# Patient Record
Sex: Male | Born: 1980 | Race: White | Marital: Single | Smoking: Current every day smoker
Health system: Southern US, Community
[De-identification: ages and names within clinical notes are randomized; demographics above are authoritative.]

## PROBLEM LIST (undated history)

## (undated) DIAGNOSIS — K259 Gastric ulcer, unspecified as acute or chronic, without hemorrhage or perforation: Secondary | ICD-10-CM

## (undated) DIAGNOSIS — K219 Gastro-esophageal reflux disease without esophagitis: Secondary | ICD-10-CM

## (undated) DIAGNOSIS — I10 Essential (primary) hypertension: Secondary | ICD-10-CM

## (undated) DIAGNOSIS — F1910 Other psychoactive substance abuse, uncomplicated: Secondary | ICD-10-CM

## (undated) DIAGNOSIS — T7840XA Allergy, unspecified, initial encounter: Secondary | ICD-10-CM

## (undated) HISTORY — DX: Allergy, unspecified, initial encounter: T78.40XA

## (undated) HISTORY — DX: Gastro-esophageal reflux disease without esophagitis: K21.9

## (undated) HISTORY — DX: Essential (primary) hypertension: I10

## (undated) HISTORY — DX: Other psychoactive substance abuse, uncomplicated: F19.10

## (undated) HISTORY — DX: Gastric ulcer, unspecified as acute or chronic, without hemorrhage or perforation: K25.9

---

## 2010-05-13 ENCOUNTER — Emergency Department (HOSPITAL_COMMUNITY): Payer: Self-pay

## 2010-05-13 ENCOUNTER — Emergency Department (HOSPITAL_COMMUNITY)
Admission: EM | Admit: 2010-05-13 | Discharge: 2010-05-13 | Disposition: A | Discharge status: Home / Self Care | Payer: Self-pay | Attending: Emergency Medicine | Admitting: Emergency Medicine

## 2010-05-13 DIAGNOSIS — R079 Chest pain, unspecified: Secondary | ICD-10-CM | POA: Insufficient documentation

## 2010-05-13 DIAGNOSIS — I498 Other specified cardiac arrhythmias: Secondary | ICD-10-CM | POA: Insufficient documentation

## 2010-05-13 DIAGNOSIS — E871 Hypo-osmolality and hyponatremia: Secondary | ICD-10-CM | POA: Insufficient documentation

## 2010-05-13 DIAGNOSIS — F1010 Alcohol abuse, uncomplicated: Secondary | ICD-10-CM | POA: Insufficient documentation

## 2010-05-13 DIAGNOSIS — E875 Hyperkalemia: Secondary | ICD-10-CM | POA: Insufficient documentation

## 2010-05-13 LAB — BASIC METABOLIC PANEL
BUN: 6 mg/dL (ref 6–23)
Chloride: 92 mEq/L — ABNORMAL LOW (ref 96–112)
Potassium: 5.5 mEq/L — ABNORMAL HIGH (ref 3.5–5.1)
Sodium: 126 mEq/L — ABNORMAL LOW (ref 135–145)

## 2019-10-01 ENCOUNTER — Emergency Department (HOSPITAL_COMMUNITY): Payer: Self-pay

## 2019-10-01 ENCOUNTER — Encounter (HOSPITAL_COMMUNITY): Payer: Self-pay | Admitting: *Deleted

## 2019-10-01 ENCOUNTER — Inpatient Hospital Stay (HOSPITAL_COMMUNITY)
Admission: EM | Admit: 2019-10-01 | Discharge: 2019-10-08 | DRG: 326 | Disposition: A | Discharge status: Home / Self Care | Payer: Self-pay | Attending: General Surgery | Admitting: General Surgery

## 2019-10-01 ENCOUNTER — Other Ambulatory Visit: Payer: Self-pay

## 2019-10-01 DIAGNOSIS — Z8249 Family history of ischemic heart disease and other diseases of the circulatory system: Secondary | ICD-10-CM

## 2019-10-01 DIAGNOSIS — K760 Fatty (change of) liver, not elsewhere classified: Secondary | ICD-10-CM | POA: Diagnosis present

## 2019-10-01 DIAGNOSIS — Z87891 Personal history of nicotine dependence: Secondary | ICD-10-CM

## 2019-10-01 DIAGNOSIS — K255 Chronic or unspecified gastric ulcer with perforation: Principal | ICD-10-CM

## 2019-10-01 DIAGNOSIS — F17290 Nicotine dependence, other tobacco product, uncomplicated: Secondary | ICD-10-CM | POA: Diagnosis present

## 2019-10-01 DIAGNOSIS — Z888 Allergy status to other drugs, medicaments and biological substances status: Secondary | ICD-10-CM

## 2019-10-01 DIAGNOSIS — E876 Hypokalemia: Secondary | ICD-10-CM

## 2019-10-01 DIAGNOSIS — I10 Essential (primary) hypertension: Secondary | ICD-10-CM | POA: Diagnosis present

## 2019-10-01 DIAGNOSIS — K265 Chronic or unspecified duodenal ulcer with perforation: Secondary | ICD-10-CM

## 2019-10-01 DIAGNOSIS — K251 Acute gastric ulcer with perforation: Secondary | ICD-10-CM | POA: Insufficient documentation

## 2019-10-01 DIAGNOSIS — K275 Chronic or unspecified peptic ulcer, site unspecified, with perforation: Secondary | ICD-10-CM

## 2019-10-01 DIAGNOSIS — K7010 Alcoholic hepatitis without ascites: Secondary | ICD-10-CM

## 2019-10-01 DIAGNOSIS — K709 Alcoholic liver disease, unspecified: Secondary | ICD-10-CM | POA: Diagnosis present

## 2019-10-01 DIAGNOSIS — K659 Peritonitis, unspecified: Secondary | ICD-10-CM | POA: Diagnosis present

## 2019-10-01 DIAGNOSIS — Z880 Allergy status to penicillin: Secondary | ICD-10-CM

## 2019-10-01 DIAGNOSIS — Z20822 Contact with and (suspected) exposure to covid-19: Secondary | ICD-10-CM | POA: Diagnosis present

## 2019-10-01 DIAGNOSIS — F1010 Alcohol abuse, uncomplicated: Secondary | ICD-10-CM

## 2019-10-01 LAB — COMPREHENSIVE METABOLIC PANEL
ALT: 17 U/L (ref 0–44)
AST: 28 U/L (ref 15–41)
Albumin: 4.5 g/dL (ref 3.5–5.0)
Alkaline Phosphatase: 82 U/L (ref 38–126)
Anion gap: 15 (ref 5–15)
BUN: 10 mg/dL (ref 6–20)
CO2: 22 mmol/L (ref 22–32)
Calcium: 9 mg/dL (ref 8.9–10.3)
Chloride: 94 mmol/L — ABNORMAL LOW (ref 98–111)
Creatinine, Ser: 0.7 mg/dL (ref 0.61–1.24)
GFR calc Af Amer: >60 mL/min (ref >60)
GFR calc non Af Amer: >60 mL/min (ref >60)
Glucose, Bld: 160 mg/dL — ABNORMAL HIGH (ref 70–99)
Potassium: 3.4 mmol/L — ABNORMAL LOW (ref 3.5–5.1)
Sodium: 131 mmol/L — ABNORMAL LOW (ref 135–145)
Total Bilirubin: 0.7 mg/dL (ref 0.3–1.2)
Total Protein: 7.8 g/dL (ref 6.5–8.1)

## 2019-10-01 LAB — CBC
HCT: 47.1 % (ref 39.0–52.0)
Hemoglobin: 15.9 g/dL (ref 13.0–17.0)
MCH: 32.8 pg (ref 26.0–34.0)
MCHC: 33.8 g/dL (ref 30.0–36.0)
MCV: 97.1 fL (ref 80.0–100.0)
Platelets: 405 10*3/uL — ABNORMAL HIGH (ref 150–400)
RBC: 4.85 MIL/uL (ref 4.22–5.81)
RDW: 12 % (ref 11.5–15.5)
WBC: 10.5 10*3/uL (ref 4.0–10.5)
nRBC: 0 % (ref 0.0–0.2)

## 2019-10-01 LAB — LIPASE, BLOOD: Lipase: 29 U/L (ref 11–51)

## 2019-10-01 MED ORDER — IOHEXOL 300 MG/ML  SOLN
100.0000 mL | Freq: Once | INTRAMUSCULAR | Status: AC | PRN
  Administered 2019-10-01: 100 mL via INTRAVENOUS

## 2019-10-01 MED ORDER — SODIUM CHLORIDE (PF) 0.9 % IJ SOLN
INTRAMUSCULAR | Status: AC
  Filled 2019-10-01: qty 50

## 2019-10-01 MED ORDER — MORPHINE SULFATE (PF) 4 MG/ML IV SOLN
4.0000 mg | Freq: Once | INTRAVENOUS | Status: AC
  Administered 2019-10-01: 4 mg via INTRAVENOUS
  Filled 2019-10-01: qty 1

## 2019-10-01 MED ORDER — SODIUM CHLORIDE 0.9% FLUSH: 3.0000 mL | Freq: Once | INTRAVENOUS | Status: DC

## 2019-10-01 MED ORDER — ONDANSETRON HCL 4 MG/2ML IJ SOLN
4.0000 mg | Freq: Once | INTRAMUSCULAR | Status: AC
  Administered 2019-10-01: 4 mg via INTRAVENOUS
  Filled 2019-10-01: qty 2

## 2019-10-01 NOTE — ED Provider Notes (Signed)
[facility]-EMERGENCY DEPT Provider Note   CSN: [id number] Arrival date & time: 10/01/19  2043     History Chief Complaint  Patient presents with  . Abdominal Pain    Danny Miller is a 39 y.o. male.  HPI     This is a 39 year old male with a history of alcohol abuse who presents with abdominal pain.  He reports acute onset of mid and right upper quadrant abdominal pain that has migrated to his entire abdomen.  He reports that it is sharp and nonradiating.  He rates his pain at 10 out of 10.  He has had nausea without vomiting.  No urinary symptoms.  No fevers, diarrhea, constipation.  Patient had not recently eaten prior to onset of pain.  He drinks 6-8 drinks daily.  No known history of pancreatitis.  History reviewed. No pertinent past medical history.  There are no problems to display for this patient.   History reviewed. No pertinent surgical history.     No family history on file.  Social History   Tobacco Use  . Smoking status: Never Smoker  . Smokeless tobacco: Never Used  Vaping Use  . Vaping Use: Every day  Substance Use Topics  . Alcohol use: Yes  . Drug use: Never    Home Medications Prior to Admission medications   Not on File    Allergies    Penicillins  Review of Systems   Review of Systems  Constitutional: Negative for fever.  Respiratory: Negative for shortness of breath.   Cardiovascular: Negative for chest pain.  Gastrointestinal: Positive for abdominal pain and nausea. Negative for constipation, diarrhea and vomiting.  Genitourinary: Negative for dysuria and hematuria.  All other systems reviewed and are negative.   Physical Exam Updated Vital Signs BP (!) 137/91   Pulse 98   Resp 15   Ht 1.753 m (5\' 9" )   Wt 68 kg   SpO2 97%   BMI 22.15 kg/m   Physical Exam Vitals and nursing note reviewed.  Constitutional:      Appearance: He is well-developed.     Comments: Uncomfortable appearing but nontoxic    HENT:     Head: Normocephalic and atraumatic.     Mouth/Throat:     Mouth: Mucous membranes are moist.  Eyes:     Pupils: Pupils are equal, round, and reactive to light.  Cardiovascular:     Rate and Rhythm: Normal rate and regular rhythm.     Heart sounds: Normal heart sounds. No murmur heard.   Pulmonary:     Effort: Pulmonary effort is normal. No respiratory distress.     Breath sounds: Normal breath sounds. No wheezing.  Abdominal:     General: Bowel sounds are normal.     Palpations: Abdomen is soft.     Tenderness: There is generalized abdominal tenderness and tenderness in the right upper quadrant and epigastric area. There is no rebound.     Comments: Voluntary guarding  Musculoskeletal:     Cervical back: Neck supple.  Lymphadenopathy:     Cervical: No cervical adenopathy.  Skin:    General: Skin is warm and dry.  Neurological:     Mental Status: He is alert and oriented to person, place, and time.  Psychiatric:        Mood and Affect: Mood is anxious.     ED Results / Procedures / Treatments   Labs (all labs ordered are listed, but only abnormal results are displayed) Labs Reviewed  COMPREHENSIVE METABOLIC PANEL - Abnormal; Notable for the following components:      Result Value   Sodium 131 (*)    Potassium 3.4 (*)    Chloride 94 (*)    Glucose, Bld 160 (*)    All other components within normal limits  CBC - Abnormal; Notable for the following components:   Platelets 405 (*)    All other components within normal limits  URINALYSIS, ROUTINE W REFLEX MICROSCOPIC - Abnormal; Notable for the following components:   Specific Gravity, Urine 1.038 (*)    Ketones, ur 20 (*)    All other components within normal limits  SARS CORONAVIRUS 2 BY RT PCR (HOSPITAL ORDER, PERFORMED IN [facility])  LIPASE, BLOOD  PROTIME-INR    EKG None  Radiology CT ABDOMEN PELVIS W CONTRAST  Result Date: 10/02/2019 CLINICAL DATA:  Nausea and vomiting EXAM: CT  ABDOMEN AND PELVIS WITH CONTRAST TECHNIQUE: Multidetector CT imaging of the abdomen and pelvis was performed using the standard protocol following bolus administration of intravenous contrast. CONTRAST:  OMNIPAQUE IOHEXOL 300 MG/ML  SOLN COMPARISON:  None. FINDINGS: LOWER CHEST: Normal. HEPATOBILIARY: Normal hepatic contours. No intra- or extrahepatic biliary dilatation. There is fluid and free air adjacent to the gallbladder. PANCREAS: Normal pancreas. No ductal dilatation or peripancreatic fluid collection. SPLEEN: Normal. ADRENALS/URINARY TRACT: The adrenal glands are normal. No hydronephrosis, nephroureterolithiasis or solid renal mass. The urinary bladder is normal for degree of distention STOMACH/BOWEL: There is small volume pneumoperitoneum, evident along the anterior hepatic margin and in the gallbladder fossa. There is hyperemia of the gastroduodenal junction wall with an apparent defect along the ventral aspect (axial image 36; sagittal images 77 and 78). There is moderate free fluid within the abdomen. The small bowel within the lower abdomen is hyperenhancing. No colonic abnormality. Appendix is normal. VASCULAR/LYMPHATIC: Normal course and caliber of the major abdominal vessels. No abdominal or pelvic lymphadenopathy. REPRODUCTIVE: Normal prostate size with symmetric seminal vesicles. MUSCULOSKELETAL. No bony spinal canal stenosis or focal osseous abnormality. OTHER: None. IMPRESSION: 1. Small volume pneumoperitoneum and free intraperitoneal fluid, likely secondary to perforated gastroduodenal ulcer (3:67, 6:77). 2. Hyperenhancement of the small bowel within the lower abdomen, likely reactive. And inflammatory or infectious enteritis would be difficult to exclude. Critical Value/emergent results were called by telephone at the time of interpretation on 10/02/2019 at 12:42 am to Dr. Ross Marcus , who verbally acknowledged these results. Electronically Signed   By: Deatra Robinson M.D.   On:  10/02/2019 00:42    Procedures Procedures (including critical care time)  CRITICAL CARE Performed by: Shon Baton   Total critical care time: 45 minutes  Critical care time was exclusive of separately billable procedures and treating other patients.  Critical care was necessary to treat or prevent imminent or life-threatening deterioration.  Critical care was time spent personally by me on the following activities: development of treatment plan with patient and/or surrogate as well as nursing, discussions with consultants, evaluation of patient's response to treatment, examination of patient, obtaining history from patient or surrogate, ordering and performing treatments and interventions, ordering and review of laboratory studies, ordering and review of radiographic studies, pulse oximetry and re-evaluation of patient's condition.   Medications Ordered in ED Medications  sodium chloride flush (NS) 0.9 % injection 3 mL (has no administration in time range)  metroNIDAZOLE (FLAGYL) IVPB 500 mg (500 mg Intravenous New Bag/Given 10/02/19 0057)  HYDROmorphone (DILAUDID) injection 1 mg (has no administration in time range)  morphine 4 MG/ML injection 4 mg (4 mg Intravenous Given 10/01/19 2324)  ondansetron (ZOFRAN) injection 4 mg (4 mg Intravenous Given 10/01/19 2323)  iohexol (OMNIPAQUE) 300 MG/ML solution 100 mL (100 mLs Intravenous Contrast Given 10/01/19 2350)  pantoprazole (PROTONIX) injection 40 mg (40 mg Intravenous Given 10/02/19 0055)  sodium chloride 0.9 % bolus 1,000 mL (1,000 mLs Intravenous New Bag/Given 10/02/19 0058)    ED Course  I have reviewed the triage vital signs and the nursing notes.  Pertinent labs & imaging results that were available during my care of the patient were reviewed by me and considered in my medical decision making (see chart for details).  Clinical Course as of Oct 01 104  Thu Oct 02, 2019  0048 Spoke with radiology.  Concern for perforated  duodenal ulcer.  Noted to have free air.  Patient was given cefepime and Flagyl for intra-abdominal infection given history of penicillin allergy.  He was given fluids and general surgery was consulted.  Covid swab pending.   [CH]  6063 Spoke with Dr. Michaell Cowing.  Asked for coags.  Will evaluate patient for emergent surgery.   [CH]    Clinical Course User Index [CH] Gabriel Paulding, Mayer Masker, MD   MDM Rules/Calculators/A&P                           Patient presents with fairly acute onset of abdominal pain.  He is uncomfortable appearing but nontoxic.  He is diffusely tender on the abdomen has some guarding suggestive of peritonitis.  Given his alcohol abuse, perforated ulcer would be high on the list.  Additionally, pancreatitis.  Less likely cholecystitis, appendicitis.  Patient was given pain and nausea medication.  Lab work obtained and reviewed.  Slight metabolic derangements with hyponatremia and hypokalemia.  CT scan obtained.  See clinical course above.  CT is concerning for a perforated gastroduodenal ulcer.  Patient covered with cefepime and Flagyl.  Dr. Michaell Cowing to consult for emergency surgery.  Currently he is not septic appearing but is still uncomfortable.  Pain medication reordered.  Patient's last alcoholic drink was last night.  Currently he does not appear to be in acute withdrawal but is at high risk.  Final Clinical Impression(s) / ED Diagnoses Final diagnoses:  Perforated ulcer (HCC)    Rx / DC Orders ED Discharge Orders    None       Jimya Ciani, Mayer Masker, MD 10/02/19 [phone]

## 2019-10-01 NOTE — ED Notes (Signed)
Pt attempted to give urine sample, Pt states that he is in too much pain to urinate at this time.

## 2019-10-01 NOTE — ED Triage Notes (Signed)
Sudden onset of rt upper abd pain a few hours ago, pt diaphoretic in triage, No n/v/d noted, Pt has never had pain this severe before.

## 2019-10-01 NOTE — ED Notes (Signed)
Patient transported to CT 

## 2019-10-02 ENCOUNTER — Encounter (HOSPITAL_COMMUNITY): Admission: EM | Disposition: A | Discharge status: Home / Self Care | Payer: Self-pay | Source: Home / Self Care

## 2019-10-02 ENCOUNTER — Emergency Department (HOSPITAL_COMMUNITY): Payer: Self-pay | Admitting: Certified Registered Nurse Anesthetist

## 2019-10-02 DIAGNOSIS — Z87891 Personal history of nicotine dependence: Secondary | ICD-10-CM

## 2019-10-02 DIAGNOSIS — K255 Chronic or unspecified gastric ulcer with perforation: Secondary | ICD-10-CM | POA: Diagnosis present

## 2019-10-02 DIAGNOSIS — K251 Acute gastric ulcer with perforation: Secondary | ICD-10-CM | POA: Insufficient documentation

## 2019-10-02 DIAGNOSIS — F1010 Alcohol abuse, uncomplicated: Secondary | ICD-10-CM

## 2019-10-02 DIAGNOSIS — K7010 Alcoholic hepatitis without ascites: Secondary | ICD-10-CM

## 2019-10-02 DIAGNOSIS — E876 Hypokalemia: Secondary | ICD-10-CM

## 2019-10-02 HISTORY — PX: LAPAROSCOPY: SHX197

## 2019-10-02 LAB — URINALYSIS, ROUTINE W REFLEX MICROSCOPIC
Bilirubin Urine: NEGATIVE
Glucose, UA: NEGATIVE mg/dL
Hgb urine dipstick: NEGATIVE
Ketones, ur: 20 mg/dL — AB
Leukocytes,Ua: NEGATIVE
Nitrite: NEGATIVE
Protein, ur: NEGATIVE mg/dL
Specific Gravity, Urine: 1.038 — ABNORMAL HIGH (ref 1.005–1.030)
pH: 5 (ref 5.0–8.0)

## 2019-10-02 LAB — HIV ANTIBODY (ROUTINE TESTING W REFLEX): HIV Screen 4th Generation wRfx: NONREACTIVE

## 2019-10-02 LAB — PROTIME-INR
INR: 1.3 — ABNORMAL HIGH (ref 0.8–1.2)
Prothrombin Time: 15.4 seconds — ABNORMAL HIGH (ref 11.4–15.2)

## 2019-10-02 LAB — PHOSPHORUS: Phosphorus: 4 mg/dL (ref 2.5–4.6)

## 2019-10-02 LAB — MAGNESIUM: Magnesium: 1.7 mg/dL (ref 1.7–2.4)

## 2019-10-02 LAB — SARS CORONAVIRUS 2 BY RT PCR (HOSPITAL ORDER, PERFORMED IN ~~LOC~~ HOSPITAL LAB): SARS Coronavirus 2: NEGATIVE

## 2019-10-02 SURGERY — LAPAROSCOPY, DIAGNOSTIC
Anesthesia: General | Site: Abdomen

## 2019-10-02 MED ORDER — PANTOPRAZOLE SODIUM 40 MG IV SOLR
40.0000 mg | Freq: Once | INTRAVENOUS | Status: AC
  Administered 2019-10-02: 40 mg via INTRAVENOUS
  Filled 2019-10-02: qty 40

## 2019-10-02 MED ORDER — LORAZEPAM 2 MG/ML IJ SOLN: 0.0000 mg | Freq: Two times a day (BID) | INTRAMUSCULAR | Status: AC

## 2019-10-02 MED ORDER — ROCURONIUM BROMIDE 10 MG/ML (PF) SYRINGE
PREFILLED_SYRINGE | INTRAVENOUS | Status: DC | PRN
  Administered 2019-10-02: 50 mg via INTRAVENOUS

## 2019-10-02 MED ORDER — HYDROMORPHONE HCL 1 MG/ML IJ SOLN
1.0000 mg | Freq: Once | INTRAMUSCULAR | Status: AC
  Administered 2019-10-02: 1 mg via INTRAVENOUS
  Filled 2019-10-02: qty 1

## 2019-10-02 MED ORDER — MIDAZOLAM HCL 5 MG/5ML IJ SOLN
INTRAMUSCULAR | Status: DC | PRN
  Administered 2019-10-02: 2 mg via INTRAVENOUS

## 2019-10-02 MED ORDER — CHLORHEXIDINE GLUCONATE CLOTH 2 % EX PADS
6.0000 | MEDICATED_PAD | Freq: Once | CUTANEOUS | Status: AC
  Administered 2019-10-02: 6 via TOPICAL

## 2019-10-02 MED ORDER — LACTATED RINGERS IV BOLUS
1000.0000 mL | Freq: Once | INTRAVENOUS | Status: AC
  Administered 2019-10-02: 1000 mL via INTRAVENOUS

## 2019-10-02 MED ORDER — PHENOL 1.4 % MT LIQD
2.0000 | OROMUCOSAL | Status: DC | PRN
  Filled 2019-10-02 (×2): qty 177

## 2019-10-02 MED ORDER — ACETAMINOPHEN 650 MG RE SUPP: 650.0000 mg | Freq: Four times a day (QID) | RECTAL | Status: DC | PRN

## 2019-10-02 MED ORDER — BISACODYL 10 MG RE SUPP
10.0000 mg | Freq: Two times a day (BID) | RECTAL | Status: DC | PRN
  Filled 2019-10-02: qty 1

## 2019-10-02 MED ORDER — METRONIDAZOLE IN NACL 5-0.79 MG/ML-% IV SOLN
500.0000 mg | Freq: Three times a day (TID) | INTRAVENOUS | Status: AC
  Administered 2019-10-02 – 2019-10-07 (×15): 500 mg via INTRAVENOUS
  Filled 2019-10-02 (×15): qty 100

## 2019-10-02 MED ORDER — FENTANYL CITRATE (PF) 100 MCG/2ML IJ SOLN
25.0000 ug | INTRAMUSCULAR | Status: DC | PRN
  Administered 2019-10-02: 25 ug via INTRAVENOUS

## 2019-10-02 MED ORDER — LIP MEDEX EX OINT
1.0000 "application " | TOPICAL_OINTMENT | Freq: Two times a day (BID) | CUTANEOUS | Status: DC
  Administered 2019-10-02 – 2019-10-08 (×10): 1 via TOPICAL
  Filled 2019-10-02: qty 7

## 2019-10-02 MED ORDER — BUPIVACAINE-EPINEPHRINE 0.25% -1:200000 IJ SOLN
INTRAMUSCULAR | Status: DC | PRN
  Administered 2019-10-02: 50 mL

## 2019-10-02 MED ORDER — SODIUM CHLORIDE 0.9 % IV SOLN: Freq: Three times a day (TID) | INTRAVENOUS | Status: DC | PRN

## 2019-10-02 MED ORDER — METHOCARBAMOL 1000 MG/10ML IJ SOLN
1000.0000 mg | Freq: Four times a day (QID) | INTRAVENOUS | Status: DC | PRN
  Filled 2019-10-02: qty 10

## 2019-10-02 MED ORDER — ROCURONIUM BROMIDE 10 MG/ML (PF) SYRINGE
PREFILLED_SYRINGE | INTRAVENOUS | Status: AC
  Filled 2019-10-02: qty 10

## 2019-10-02 MED ORDER — SODIUM CHLORIDE 0.9 % IV SOLN
2.0000 g | Freq: Three times a day (TID) | INTRAVENOUS | Status: AC
  Administered 2019-10-03 – 2019-10-06 (×12): 2 g via INTRAVENOUS
  Filled 2019-10-02 (×12): qty 2

## 2019-10-02 MED ORDER — MIDAZOLAM HCL 2 MG/2ML IJ SOLN
INTRAMUSCULAR | Status: AC
  Filled 2019-10-02: qty 2

## 2019-10-02 MED ORDER — LIDOCAINE 2% (20 MG/ML) 5 ML SYRINGE
INTRAMUSCULAR | Status: DC | PRN
  Administered 2019-10-02: 60 mg via INTRAVENOUS

## 2019-10-02 MED ORDER — MAGNESIUM SULFATE 2 GM/50ML IV SOLN
2.0000 g | Freq: Once | INTRAVENOUS | Status: DC
  Filled 2019-10-02: qty 50

## 2019-10-02 MED ORDER — SODIUM CHLORIDE 0.9 % IV SOLN
80.0000 mg | Freq: Once | INTRAVENOUS | Status: AC
  Administered 2019-10-02: 80 mg via INTRAVENOUS
  Filled 2019-10-02: qty 80

## 2019-10-02 MED ORDER — SODIUM CHLORIDE 0.9 % IV SOLN
2.0000 g | Freq: Once | INTRAVENOUS | Status: AC
  Administered 2019-10-02: 2 g via INTRAVENOUS
  Filled 2019-10-02: qty 2

## 2019-10-02 MED ORDER — ONDANSETRON HCL 4 MG/2ML IJ SOLN
INTRAMUSCULAR | Status: AC
  Filled 2019-10-02: qty 2

## 2019-10-02 MED ORDER — LACTATED RINGERS IV BOLUS: 1000.0000 mL | Freq: Three times a day (TID) | INTRAVENOUS | Status: AC | PRN

## 2019-10-02 MED ORDER — PROPOFOL 10 MG/ML IV BOLUS
INTRAVENOUS | Status: DC | PRN
  Administered 2019-10-02: 170150 mg via INTRAVENOUS

## 2019-10-02 MED ORDER — CLINDAMYCIN PHOSPHATE 900 MG/50ML IV SOLN
INTRAVENOUS | Status: AC
  Administered 2019-10-02: 900 mg
  Filled 2019-10-02: qty 50

## 2019-10-02 MED ORDER — SIMETHICONE 80 MG PO CHEW
40.0000 mg | CHEWABLE_TABLET | Freq: Four times a day (QID) | ORAL | Status: DC | PRN
  Filled 2019-10-02: qty 1

## 2019-10-02 MED ORDER — FENTANYL CITRATE (PF) 100 MCG/2ML IJ SOLN
INTRAMUSCULAR | Status: AC
  Filled 2019-10-02: qty 2

## 2019-10-02 MED ORDER — LACTATED RINGERS IV SOLN
INTRAVENOUS | Status: DC
  Administered 2019-10-02: 1000 mL via INTRAVENOUS

## 2019-10-02 MED ORDER — ALBUMIN HUMAN 5 % IV SOLN
INTRAVENOUS | Status: AC
  Filled 2019-10-02: qty 250

## 2019-10-02 MED ORDER — METOPROLOL TARTRATE 5 MG/5ML IV SOLN: 5.0000 mg | Freq: Four times a day (QID) | INTRAVENOUS | Status: DC | PRN

## 2019-10-02 MED ORDER — LORAZEPAM 2 MG/ML IJ SOLN: 1.0000 mg | INTRAMUSCULAR | Status: AC | PRN

## 2019-10-02 MED ORDER — MENTHOL 3 MG MT LOZG
1.0000 | LOZENGE | OROMUCOSAL | Status: DC | PRN
  Filled 2019-10-02 (×3): qty 9

## 2019-10-02 MED ORDER — ACETAMINOPHEN 10 MG/ML IV SOLN
INTRAVENOUS | Status: DC | PRN
Start: 2019-10-02 — End: 2019-10-02
  Administered 2019-10-02: 1000 mg via INTRAVENOUS

## 2019-10-02 MED ORDER — LORAZEPAM 1 MG PO TABS: 1.0000 mg | ORAL_TABLET | ORAL | Status: AC | PRN

## 2019-10-02 MED ORDER — FENTANYL CITRATE (PF) 100 MCG/2ML IJ SOLN
INTRAMUSCULAR | Status: DC | PRN
  Administered 2019-10-02: 100 ug via INTRAVENOUS
  Administered 2019-10-02 (×3): 50 ug via INTRAVENOUS

## 2019-10-02 MED ORDER — ENOXAPARIN SODIUM 40 MG/0.4ML ~~LOC~~ SOLN
40.0000 mg | SUBCUTANEOUS | Status: DC
  Administered 2019-10-02 – 2019-10-07 (×6): 40 mg via SUBCUTANEOUS
  Filled 2019-10-02 (×6): qty 0.4

## 2019-10-02 MED ORDER — FENTANYL CITRATE (PF) 250 MCG/5ML IJ SOLN
INTRAMUSCULAR | Status: AC
  Filled 2019-10-02: qty 5

## 2019-10-02 MED ORDER — POTASSIUM CHLORIDE 10 MEQ/100ML IV SOLN
10.0000 meq | INTRAVENOUS | Status: AC
  Administered 2019-10-02: 10 meq via INTRAVENOUS
  Filled 2019-10-02: qty 100

## 2019-10-02 MED ORDER — LIDOCAINE 2% (20 MG/ML) 5 ML SYRINGE
INTRAMUSCULAR | Status: AC
  Filled 2019-10-02: qty 5

## 2019-10-02 MED ORDER — HYDROMORPHONE HCL 1 MG/ML IJ SOLN: 0.2500 mg | INTRAMUSCULAR | Status: DC | PRN

## 2019-10-02 MED ORDER — DIPHENHYDRAMINE HCL 50 MG/ML IJ SOLN: 12.5000 mg | Freq: Four times a day (QID) | INTRAMUSCULAR | Status: DC | PRN

## 2019-10-02 MED ORDER — MAGIC MOUTHWASH
15.0000 mL | Freq: Four times a day (QID) | ORAL | Status: DC | PRN
  Filled 2019-10-02: qty 15

## 2019-10-02 MED ORDER — SUCCINYLCHOLINE CHLORIDE 200 MG/10ML IV SOSY
PREFILLED_SYRINGE | INTRAVENOUS | Status: AC
  Filled 2019-10-02: qty 10

## 2019-10-02 MED ORDER — DEXAMETHASONE SODIUM PHOSPHATE 4 MG/ML IJ SOLN
INTRAMUSCULAR | Status: DC | PRN
  Administered 2019-10-02: 10 mg via INTRAVENOUS

## 2019-10-02 MED ORDER — SODIUM CHLORIDE 0.9 % IV SOLN
8.0000 mg/h | INTRAVENOUS | Status: AC
  Administered 2019-10-02 – 2019-10-05 (×5): 8 mg/h via INTRAVENOUS
  Filled 2019-10-02 (×7): qty 80

## 2019-10-02 MED ORDER — PROMETHAZINE HCL 25 MG/ML IJ SOLN: 6.2500 mg | INTRAMUSCULAR | Status: DC | PRN

## 2019-10-02 MED ORDER — OXYCODONE HCL 5 MG PO TABS: 5.0000 mg | ORAL_TABLET | Freq: Once | ORAL | Status: DC | PRN

## 2019-10-02 MED ORDER — CLINDAMYCIN PHOSPHATE 900 MG/50ML IV SOLN: 900.0000 mg | INTRAVENOUS | Status: AC

## 2019-10-02 MED ORDER — ONDANSETRON HCL 4 MG/2ML IJ SOLN
INTRAMUSCULAR | Status: DC | PRN
  Administered 2019-10-02: 4 mg via INTRAVENOUS

## 2019-10-02 MED ORDER — BUPIVACAINE LIPOSOME 1.3 % IJ SUSP
INTRAMUSCULAR | Status: DC | PRN
  Administered 2019-10-02: 20 mL

## 2019-10-02 MED ORDER — METRONIDAZOLE IN NACL 5-0.79 MG/ML-% IV SOLN
500.0000 mg | Freq: Once | INTRAVENOUS | Status: AC
  Administered 2019-10-02: 500 mg via INTRAVENOUS
  Filled 2019-10-02: qty 100

## 2019-10-02 MED ORDER — PANTOPRAZOLE SODIUM 40 MG IV SOLR: 40.0000 mg | Freq: Two times a day (BID) | INTRAVENOUS | Status: DC

## 2019-10-02 MED ORDER — PHENYLEPHRINE 40 MCG/ML (10ML) SYRINGE FOR IV PUSH (FOR BLOOD PRESSURE SUPPORT)
PREFILLED_SYRINGE | INTRAVENOUS | Status: DC | PRN
  Administered 2019-10-02 (×2): 120 ug via INTRAVENOUS
  Administered 2019-10-02: 80 ug via INTRAVENOUS
  Administered 2019-10-02: 120 ug via INTRAVENOUS
  Administered 2019-10-02: 200 ug via INTRAVENOUS

## 2019-10-02 MED ORDER — OXYCODONE HCL 5 MG/5ML PO SOLN: 5.0000 mg | Freq: Once | ORAL | Status: DC | PRN

## 2019-10-02 MED ORDER — THIAMINE HCL 100 MG/ML IJ SOLN
100.0000 mg | Freq: Every day | INTRAMUSCULAR | Status: DC
  Administered 2019-10-02 – 2019-10-07 (×5): 100 mg via INTRAVENOUS
  Filled 2019-10-02 (×5): qty 2

## 2019-10-02 MED ORDER — SODIUM CHLORIDE 0.9 % IV BOLUS
1000.0000 mL | Freq: Once | INTRAVENOUS | Status: AC
  Administered 2019-10-02: 1000 mL via INTRAVENOUS

## 2019-10-02 MED ORDER — PHENYLEPHRINE HCL-NACL 10-0.9 MG/250ML-% IV SOLN
INTRAVENOUS | Status: DC | PRN
  Administered 2019-10-02: 35 ug/min via INTRAVENOUS

## 2019-10-02 MED ORDER — SODIUM CHLORIDE 0.9 % IV SOLN
2.0000 g | Freq: Three times a day (TID) | INTRAVENOUS | Status: DC
  Administered 2019-10-02: 2 g via INTRAVENOUS
  Filled 2019-10-02 (×7): qty 2

## 2019-10-02 MED ORDER — DEXTROSE 5 % IV SOLN
5.0000 mg/kg | INTRAVENOUS | Status: AC
  Administered 2019-10-02: 340 mg via INTRAVENOUS
  Filled 2019-10-02 (×2): qty 8.5

## 2019-10-02 MED ORDER — PHENYLEPHRINE 40 MCG/ML (10ML) SYRINGE FOR IV PUSH (FOR BLOOD PRESSURE SUPPORT)
PREFILLED_SYRINGE | INTRAVENOUS | Status: AC
  Filled 2019-10-02: qty 10

## 2019-10-02 MED ORDER — LACTATED RINGERS IV SOLN: INTRAVENOUS | Status: DC | PRN

## 2019-10-02 MED ORDER — MEPERIDINE HCL 50 MG/ML IJ SOLN: 6.2500 mg | INTRAMUSCULAR | Status: DC | PRN

## 2019-10-02 MED ORDER — ACETAMINOPHEN 10 MG/ML IV SOLN
INTRAVENOUS | Status: AC
  Filled 2019-10-02: qty 100

## 2019-10-02 MED ORDER — BUPIVACAINE-EPINEPHRINE 0.25% -1:200000 IJ SOLN
INTRAMUSCULAR | Status: AC
  Filled 2019-10-02: qty 1

## 2019-10-02 MED ORDER — BUPIVACAINE LIPOSOME 1.3 % IJ SUSP
20.0000 mL | Freq: Once | INTRAMUSCULAR | Status: DC
  Filled 2019-10-02: qty 20

## 2019-10-02 MED ORDER — HYDROMORPHONE HCL 1 MG/ML IJ SOLN
0.5000 mg | INTRAMUSCULAR | Status: DC | PRN
  Administered 2019-10-02 – 2019-10-04 (×5): 1 mg via INTRAVENOUS
  Filled 2019-10-02 (×6): qty 1

## 2019-10-02 MED ORDER — SUCCINYLCHOLINE CHLORIDE 200 MG/10ML IV SOSY
PREFILLED_SYRINGE | INTRAVENOUS | Status: DC | PRN
  Administered 2019-10-02: 120 mg via INTRAVENOUS

## 2019-10-02 MED ORDER — 0.9 % SODIUM CHLORIDE (POUR BTL) OPTIME
TOPICAL | Status: DC | PRN
  Administered 2019-10-02: 1000 mL

## 2019-10-02 MED ORDER — SODIUM CHLORIDE 0.9 % IV SOLN
8.0000 mg | Freq: Four times a day (QID) | INTRAVENOUS | Status: DC | PRN
  Filled 2019-10-02: qty 4

## 2019-10-02 MED ORDER — LORAZEPAM 2 MG/ML IJ SOLN: 0.0000 mg | Freq: Four times a day (QID) | INTRAMUSCULAR | Status: AC

## 2019-10-02 MED ORDER — PROCHLORPERAZINE EDISYLATE 10 MG/2ML IJ SOLN: 5.0000 mg | INTRAMUSCULAR | Status: DC | PRN

## 2019-10-02 MED ORDER — ALBUMIN HUMAN 5 % IV SOLN: INTRAVENOUS | Status: DC | PRN

## 2019-10-02 MED ORDER — THIAMINE HCL 100 MG PO TABS
100.0000 mg | ORAL_TABLET | Freq: Every day | ORAL | Status: DC
  Administered 2019-10-04 – 2019-10-08 (×2): 100 mg via ORAL
  Filled 2019-10-02 (×2): qty 1

## 2019-10-02 MED ORDER — PANTOPRAZOLE SODIUM 40 MG IV SOLR
40.0000 mg | Freq: Two times a day (BID) | INTRAVENOUS | Status: DC
  Administered 2019-10-05 – 2019-10-08 (×6): 40 mg via INTRAVENOUS
  Filled 2019-10-02 (×8): qty 40

## 2019-10-02 MED ORDER — DEXAMETHASONE SODIUM PHOSPHATE 10 MG/ML IJ SOLN
INTRAMUSCULAR | Status: AC
  Filled 2019-10-02: qty 1

## 2019-10-02 MED ORDER — ONDANSETRON HCL 4 MG/2ML IJ SOLN: 4.0000 mg | Freq: Four times a day (QID) | INTRAMUSCULAR | Status: DC | PRN

## 2019-10-02 MED ORDER — LACTATED RINGERS IR SOLN
Status: DC | PRN
  Administered 2019-10-02: 5000 mL
  Administered 2019-10-02: 4000 mL

## 2019-10-02 SURGICAL SUPPLY — 42 items
CABLE HIGH FREQUENCY MONO STRZ (ELECTRODE) ×2 IMPLANT
COVER SURGICAL LIGHT HANDLE (MISCELLANEOUS) ×2 IMPLANT
COVER WAND RF STERILE (DRAPES) IMPLANT
DECANTER SPIKE VIAL GLASS SM (MISCELLANEOUS) ×2 IMPLANT
DRAPE UTILITY XL STRL (DRAPES) ×2 IMPLANT
DRAPE WARM FLUID 44X44 (DRAPES) ×2 IMPLANT
DRSG TEGADERM 2-3/8X2-3/4 SM (GAUZE/BANDAGES/DRESSINGS) ×6 IMPLANT
DRSG TEGADERM 4X4.75 (GAUZE/BANDAGES/DRESSINGS) ×4 IMPLANT
ELECT REM PT RETURN 15FT ADLT (MISCELLANEOUS) ×2 IMPLANT
GAUZE SPONGE 2X2 8PLY STRL LF (GAUZE/BANDAGES/DRESSINGS) ×1 IMPLANT
GLOVE BIO SURGEON STRL SZ8 (GLOVE) ×2 IMPLANT
GLOVE BIOGEL PI IND STRL 7.5 (GLOVE) ×2 IMPLANT
GLOVE BIOGEL PI IND STRL 8 (GLOVE) ×2 IMPLANT
GLOVE BIOGEL PI INDICATOR 7.5 (GLOVE) ×2
GLOVE BIOGEL PI INDICATOR 8 (GLOVE) ×2
GLOVE ECLIPSE 8.0 STRL XLNG CF (GLOVE) ×2 IMPLANT
GLOVE INDICATOR 8.0 STRL GRN (GLOVE) ×2 IMPLANT
GOWN STRL REUS W/TWL XL LVL3 (GOWN DISPOSABLE) ×4 IMPLANT
IRRIG SUCT STRYKERFLOW 2 WTIP (MISCELLANEOUS) ×2
IRRIGATION SUCT STRKRFLW 2 WTP (MISCELLANEOUS) ×1 IMPLANT
KIT BASIN (CUSTOM PROCEDURE TRAY) ×2 IMPLANT
KIT TURNOVER KIT A (KITS) ×2 IMPLANT
PAD POSITIONING PINK XL (MISCELLANEOUS) ×2 IMPLANT
SCISSORS LAP 5X35 DISP (ENDOMECHANICALS) ×2 IMPLANT
SEALER TISSUE G2 STRG ARTC 35C (ENDOMECHANICALS) IMPLANT
SET TUBE SMOKE EVAC HIGH FLOW (TUBING) ×2 IMPLANT
SHEARS HARMONIC ACE PLUS 36CM (ENDOMECHANICALS) ×2 IMPLANT
SLEEVE XCEL OPT CAN 5 100 (ENDOMECHANICALS) ×4 IMPLANT
SPONGE GAUZE 2X2 STER 10/PKG (GAUZE/BANDAGES/DRESSINGS) ×1
SUT MNCRL AB 4-0 PS2 18 (SUTURE) ×2 IMPLANT
SUT SILK 2 0 (SUTURE) ×1
SUT SILK 2 0 SH CR/8 (SUTURE) ×2 IMPLANT
SUT SILK 2-0 18XBRD TIE 12 (SUTURE) ×1 IMPLANT
SUT SILK 3 0 (SUTURE) ×1
SUT SILK 3 0 SH CR/8 (SUTURE) ×2 IMPLANT
SUT SILK 3-0 18XBRD TIE 12 (SUTURE) ×1 IMPLANT
SUT VLOC 180 2-0 6IN GS21 (SUTURE) ×2 IMPLANT
TOWEL OR 17X26 10 PK STRL BLUE (TOWEL DISPOSABLE) ×2 IMPLANT
TOWEL OR NON WOVEN STRL DISP B (DISPOSABLE) ×2 IMPLANT
TRAY LAPAROSCOPIC (CUSTOM PROCEDURE TRAY) ×2 IMPLANT
TROCAR BLADELESS OPT 5 100 (ENDOMECHANICALS) ×2 IMPLANT
TROCAR XCEL NON-BLD 11X100MML (ENDOMECHANICALS) IMPLANT

## 2019-10-02 NOTE — H&P (Addendum)
Danny Miller  [birth year] [id number]  CARE TEAM:  PCP: Patient, No Pcp Per  Outpatient Care Team: Patient Care Team: Patient, No Pcp Per as PCP - General (General Practice)  Inpatient Treatment Team: Treatment Team: Attending Provider: Shon Baton, MD; Registered Nurse: Terressa Koyanagi, RN; Consulting Physician: Montez Morita, Md, MD   This patient is a 39 y.o.male who presents today for surgical evaluation at the request of Dr Wilkie Aye.   Chief complaint / Reason for evaluation: Severe abdominal pain with free air.  Probable perforated ulcer  39 year old male.  History of alcohol drinking rather heavily.  6-8 drinks a night.  Noted severe abdominal pain today.  Gradually worsened and intensified.  Became unbearable.  Nauseated with decreased appetite.  No emesis.  Came emergency room.  Concern for abdominal peritonitis.  CT scan shows dots of pneumoperitoneum in the upper abdomen with focus most likely around the gastroduodenal junction.  Highly suspicious for perforated ulcer.  Patient will have some thickened small intestinal loops.  Patient denies any dietary changes or sick contacts.  He has never had any prior abdominal surgery.  He vapes but does not smoke tobacco.  Denies any heart or lung issues.  Normally can walk a couple miles without difficulty.  Normally moves his bowels a couple times a day  No personal nor family history of GI/colon cancer, inflammatory bowel disease, irritable bowel syndrome, allergy such as Celiac Sprue, dietary/dairy problems, colitis, ulcers nor gastritis.  No recent sick contacts/gastroenteritis.  No travel outside the country.  No changes in diet.  No dysphagia to solids or liquids.  No significant heartburn or reflux.  No hematochezia, hematemesis, coffee ground emesis.  No evidence of prior gastric/peptic ulceration.    Assessment  Danny Miller  39 y.o. male    Problem List:  Principal Problem:   Perforated duodenal ulcer (HCC) Active  Problems:   Alcohol consumption binge drinking   History of nicotine vaping   Peritonitis with free air suspicion for perforated ulcer  Plan:  Admission  IV antibiotics.  Nausea and pain control.  Laparoscopic possible open exploration with probable omental [name] patch of perforated ulcer.  The anatomy & physiology of the digestive tract was discussed.  The pathophysiology of perforation was discussed.  Differential diagnosis such as perforated ulcer or colon, etc was discussed.   Natural history risks without surgery such as death was discussed.  I recommended abdominal exploration to diagnose & treat the source of the problem.  Laparoscopic & open techniques were discussed.   Risks such as bleeding, infection, abscess, leak, reoperation, bowel resection, possible ostomy, injury to other organs, need for repair of tissues / organs, hernia, heart attack, death, and other risks were discussed.   The risks of no intervention will lead to serious problems including death.   I expressed a good likelihood that surgery will address the problem.    Goals of post-operative recovery were discussed as well.  We will work to minimize complications although risks in an emergent setting are high.   Questions were answered.  The patient expressed understanding & wishes to proceed with surgery.       Heavy alcohol consumption.  I strongly recommend he quit drinking since he has now had a life-threatening complication to his alcoholism and is at very high risk of alcohol threatening his life and reducing his life expectancy.  PPI  NGT  Hypokalemia-correct  Hypomagnesemia-correct.  VTE prophylaxis- SCDs, etc   35 minutes spent in review,  evaluation, examination, counseling, and coordination of care.  More than 50% of that time was spent in counseling.  Ardeth Sportsman, MD, FACS, MASCRS Gastrointestinal and Minimally Invasive Surgery  [facility] [address] [place], [place] [address] [phone] Fax [phone] Main/Paging  CONTACT INFORMATION: Weekday (9AM-5PM) concerns: Call [facility] main office at [phone] Weeknight (5PM-9AM) or Weekend/Holiday concerns: Check [email or web address] for General Surgery [facility] coverage (Please, do not use SecureChat as it is not reliable communication to operating surgeons for immediate patient care)      10/02/2019      History reviewed. No pertinent past medical history.  History reviewed. No pertinent surgical history.  Social History   Socioeconomic History  . Marital status: Single    Spouse name: Not on file  . Number of children: Not on file  . Years of education: Not on file  . Highest education level: Not on file  Occupational History  . Not on file  Tobacco Use  . Smoking status: Never Smoker  . Smokeless tobacco: Never Used  Vaping Use  . Vaping Use: Every day  Substance and Sexual Activity  . Alcohol use: Yes  . Drug use: Never  . Sexual activity: Not on file  Other Topics Concern  . Not on file  Social History Narrative  . Not on file   Social Determinants of Health   Financial Resource Strain:   . Difficulty of Paying Living Expenses:   Food Insecurity:   . Worried About Programme researcher, broadcasting/film/video in the Last Year:   . Barista in the Last Year:   Transportation Needs:   . Freight forwarder (Medical):   Marland Kitchen Lack of Transportation (Non-Medical):   Physical Activity:   . Days of Exercise per Week:   . Minutes of Exercise per Session:   Stress:   . Feeling of Stress :   Social Connections:   . Frequency of Communication with Friends and Family:   . Frequency of Social Gatherings with Friends and Family:   . Attends Religious Services:   . Active Member of Clubs or Organizations:   . Attends Banker Meetings:   Marland Kitchen Marital Status:   Intimate Partner Violence:   . Fear of Current or Ex-Partner:   . Emotionally Abused:   Marland Kitchen Physically Abused:   .  Sexually Abused:     No family history on file.  Current Facility-Administered Medications  Medication Dose Route Frequency Provider Last Rate Last Admin  . ceFEPIme (MAXIPIME) 2 g in sodium chloride 0.9 % 100 mL IVPB  2 g Intravenous Once Horton, Mayer Masker, MD      . Chlorhexidine Gluconate Cloth 2 % PADS 6 each  6 each Topical Once Karie Soda, MD      . clindamycin (CLEOCIN) IVPB 900 mg  900 mg Intravenous On Call to OR Karie Soda, MD       And  . gentamicin (GARAMYCIN) 340 mg in dextrose 5 % 100 mL IVPB  5 mg/kg Intravenous On Call to OR Karie Soda, MD      . lactated ringers bolus 1,000 mL  1,000 mL Intravenous Once Karie Soda, MD      . metroNIDAZOLE (FLAGYL) IVPB 500 mg  500 mg Intravenous Once Shon Baton, MD 100 mL/hr at 10/02/19 0057 500 mg at 10/02/19 0057  . potassium chloride 10 mEq in 100 mL IVPB  10 mEq Intravenous Q1 Hr x 4 Jeffifer Rabold, Viviann Spare,  MD      . sodium chloride flush (NS) 0.9 % injection 3 mL  3 mL Intravenous Once Horton, Mayer Maskerourtney F, MD       Current Outpatient Medications  Medication Sig Dispense Refill  . aspirin EC 81 MG tablet Take 81 mg by mouth daily. Swallow whole.       Allergies  Allergen Reactions  . Penicillins     ROS:   All other systems reviewed & are negative except per HPI or as noted below: Constitutional:  No fevers, +chills, +sweats.  Weight stable Eyes:  No vision changes, No discharge HENT:  No sore throats, nasal drainage Lymph: No neck swelling, No bruising easily Pulmonary:  No cough, productive sputum CV: No orthopnea, PND  Patient walks 60 minutes for about 2 miles without difficulty.  No exertional chest/neck/shoulder/arm pain. GI: No personal nor family history of GI/colon cancer, inflammatory bowel disease, irritable bowel syndrome, allergy such as Celiac Sprue, dietary/dairy problems, colitis, ulcers nor gastritis.  No recent sick contacts/gastroenteritis.  No travel outside the country.  No changes in  diet. Renal: No UTIs, No hematuria Genital:  No drainage, bleeding, masses Musculoskeletal: No severe joint pain.  Good ROM major joints Skin:  No sores or lesions.  No rashes Heme/Lymph:  No easy bleeding.  No swollen lymph nodes Neuro: No focal weakness/numbness.  No seizures Psych: No suicidal ideation.  No hallucinations  BP 129/74   Pulse (!) 110   Resp 17   Ht 5\' 9"  (1.753 m)   Wt 68 kg   SpO2 100%   BMI 22.15 kg/m   Physical Exam: Constitutional: Patient obviously uncomfortable in moderate distress.  Not cachectic.  Hygeine adequate.  Vitals signs as above.   Eyes: Pupils reactive, normal extraocular movements. Sclera nonicteric Neuro: CN II-XII intact.  No major focal sensory defects.  No major motor deficits. Lymph: No head/neck/groin lymphadenopathy Psych:  No severe agitation.  No severe anxiety.  Judgment & insight Adequate, Oriented x4, HENT: Normocephalic, Mucus membranes moist.  No thrush.   Neck: Supple, No tracheal deviation.  No obvious thyromegaly Chest: No pain to chest wall compression.  Good respiratory excursion.  No audible wheezing CV:  Pulses intact.  Regular rhythm.  No major extremity edema Abdomen:  Somewhat firm.  Mildly distended.  Tenderness at epigastric region with guarding.  Pain to bed shake and percussion consistent with peritonitis.  Lower abdomen nontender. No incarcerated hernias.  No hepatomegaly.  No splenomegaly Gen:  No inguinal hernias.  No inguinal lymphadenopathy.   Ext: No obvious deformity or contracture no significant edema.  No cyanosis Skin: No major subcutaneous nodules.  Warm and dry Musculoskeletal: Severe joint rigidity not present.  No obvious clubbing.  No digital petechiae.     Results:   Labs: Results for orders placed or performed during the hospital encounter of 10/01/19 (from the past 48 hour(s))  Lipase, blood     Status: None   Collection Time: 10/01/19  9:14 PM  Result Value Ref Range   Lipase 29 11 - 51 U/L     Comment: Performed at [facility], [address] [place], [place] [id number]  Comprehensive metabolic panel     Status: Abnormal   Collection Time: 10/01/19  9:14 PM  Result Value Ref Range   Sodium 131 (L) 135 - 145 mmol/L   Potassium 3.4 (L) 3.5 - 5.1 mmol/L   Chloride 94 (L) 98 - 111 mmol/L   CO2 22 22 - 32  mmol/L   Glucose, Bld 160 (H) 70 - 99 mg/dL    Comment: Glucose reference range applies only to samples taken after fasting for at least 8 hours.   BUN 10 6 - 20 mg/dL   Creatinine, Ser 1.44 0.61 - 1.24 mg/dL   Calcium 9.0 8.9 - 81.8 mg/dL   Total Protein 7.8 6.5 - 8.1 g/dL   Albumin 4.5 3.5 - 5.0 g/dL   AST 28 15 - 41 U/L   ALT 17 0 - 44 U/L   Alkaline Phosphatase 82 38 - 126 U/L   Total Bilirubin 0.7 0.3 - 1.2 mg/dL   GFR calc non Af Amer >60 >60 mL/min   GFR calc Af Amer >60 >60 mL/min   Anion gap 15 5 - 15    Comment: Performed at [facility], [address] [place], [place] [address]  CBC     Status: Abnormal   Collection Time: 10/01/19  9:14 PM  Result Value Ref Range   WBC 10.5 4.0 - 10.5 K/uL   RBC 4.85 4.22 - 5.81 MIL/uL   Hemoglobin 15.9 13.0 - 17.0 g/dL   HCT 97.0 39 - 52 %   MCV 97.1 80.0 - 100.0 fL   MCH 32.8 26.0 - 34.0 pg   MCHC 33.8 30.0 - 36.0 g/dL   RDW 26.3 78.5 - 88.5 %   Platelets 405 (H) 150 - 400 K/uL   nRBC 0.0 0.0 - 0.2 %    Comment: Performed at [facility], [address] [place], [place] [address]  Urinalysis, Routine w reflex microscopic     Status: Abnormal   Collection Time: 10/02/19 12:10 AM  Result Value Ref Range   Color, Urine YELLOW YELLOW   APPearance CLEAR CLEAR   Specific Gravity, Urine 1.038 (H) 1.005 - 1.030   pH 5.0 5.0 - 8.0   Glucose, UA NEGATIVE NEGATIVE mg/dL   Hgb urine dipstick NEGATIVE NEGATIVE   Bilirubin Urine NEGATIVE NEGATIVE   Ketones, ur 20 (A) NEGATIVE mg/dL   Protein, ur NEGATIVE NEGATIVE mg/dL   Nitrite NEGATIVE NEGATIVE   Leukocytes,Ua  NEGATIVE NEGATIVE    Comment: Performed at [facility], [address], [place], [place] [address]  Protime-INR     Status: Abnormal   Collection Time: 10/02/19  1:07 AM  Result Value Ref Range   Prothrombin Time 15.4 (H) 11.4 - 15.2 seconds   INR 1.3 (H) 0.8 - 1.2    Comment: (NOTE) INR goal varies based on device and disease states. Performed at [facility], [address] [place], [place] [address]     Imaging / Studies: CT ABDOMEN PELVIS W CONTRAST  Result Date: 10/02/2019 CLINICAL DATA:  Nausea and vomiting EXAM: CT ABDOMEN AND PELVIS WITH CONTRAST TECHNIQUE: Multidetector CT imaging of the abdomen and pelvis was performed using the standard protocol following bolus administration of intravenous contrast. CONTRAST:  OMNIPAQUE IOHEXOL 300 MG/ML  SOLN COMPARISON:  None. FINDINGS: LOWER CHEST: Normal. HEPATOBILIARY: Normal hepatic contours. No intra- or extrahepatic biliary dilatation. There is fluid and free air adjacent to the gallbladder. PANCREAS: Normal pancreas. No ductal dilatation or peripancreatic fluid collection. SPLEEN: Normal. ADRENALS/URINARY TRACT: The adrenal glands are normal. No hydronephrosis, nephroureterolithiasis or solid renal mass. The urinary bladder is normal for degree of distention STOMACH/BOWEL: There is small volume pneumoperitoneum, evident along the anterior hepatic margin and in the gallbladder fossa. There is hyperemia of the gastroduodenal junction wall with an apparent defect  along the ventral aspect (axial image 36; sagittal images 77 and 78). There is moderate free fluid within the abdomen. The small bowel within the lower abdomen is hyperenhancing. No colonic abnormality. Appendix is normal. VASCULAR/LYMPHATIC: Normal course and caliber of the major abdominal vessels. No abdominal or pelvic lymphadenopathy. REPRODUCTIVE: Normal prostate size with symmetric seminal vesicles. MUSCULOSKELETAL. No bony spinal canal  stenosis or focal osseous abnormality. OTHER: None. IMPRESSION: 1. Small volume pneumoperitoneum and free intraperitoneal fluid, likely secondary to perforated gastroduodenal ulcer (3:67, 6:77). 2. Hyperenhancement of the small bowel within the lower abdomen, likely reactive. And inflammatory or infectious enteritis would be difficult to exclude. Critical Value/emergent results were called by telephone at the time of interpretation on 10/02/2019 at 12:42 am to Dr. Ross Marcus , who verbally acknowledged these results. Electronically Signed   By: Deatra Robinson M.D.   On: 10/02/2019 00:42    Medications / Allergies: per chart  Antibiotics: Anti-infectives (From admission, onward)   Start     Dose/Rate Route Frequency Ordered Stop   10/02/19 0600  clindamycin (CLEOCIN) IVPB 900 mg     Discontinue    "And" Linked Group Details   900 mg 100 mL/hr over 30 Minutes Intravenous On call to O.R. 10/02/19 0124 10/03/19 0559   10/02/19 0600  gentamicin (GARAMYCIN) 340 mg in dextrose 5 % 100 mL IVPB     Discontinue    "And" Linked Group Details   5 mg/kg  68 kg 108.5 mL/hr over 60 Minutes Intravenous On call to O.R. 10/02/19 0124 10/03/19 0559   10/02/19 0130  ceFEPIme (MAXIPIME) 2 g in sodium chloride 0.9 % 100 mL IVPB     Discontinue     2 g 200 mL/hr over 30 Minutes Intravenous  Once 10/02/19 0125     10/02/19 0100  metroNIDAZOLE (FLAGYL) IVPB 500 mg     Discontinue     500 mg 100 mL/hr over 60 Minutes Intravenous  Once 10/02/19 0045          Note: Portions of this report may have been transcribed using voice recognition software. Every effort was made to ensure accuracy; however, inadvertent computerized transcription errors may be present.   Any transcriptional errors that result from this process are unintentional.    Ardeth Sportsman, MD, FACS, MASCRS Gastrointestinal and Minimally Invasive Surgery  [facility] [address] [place], [place]  [address] [phone] Fax [phone] Main/Paging  CONTACT INFORMATION: Weekday (9AM-5PM) concerns: Call [facility] main office at [phone] Weeknight (5PM-9AM) or Weekend/Holiday concerns: Check [email or web address] for General Surgery [facility] coverage (Please, do not use SecureChat as it is not reliable communication to operating surgeons for immediate patient care)      10/02/2019  1:25 AM

## 2019-10-02 NOTE — Anesthesia Postprocedure Evaluation (Signed)
Anesthesia Post Note  Patient: Danny Miller  Procedure(s) Performed: LAPAROSCOPY PERFORATED ULCER (N/A Abdomen)     Patient location during evaluation: PACU Anesthesia Type: General Level of consciousness: awake and alert, oriented and patient cooperative Pain management: pain level controlled Vital Signs Assessment: post-procedure vital signs reviewed and stable Respiratory status: spontaneous breathing, nonlabored ventilation and respiratory function stable Cardiovascular status: blood pressure returned to baseline and stable Postop Assessment: no apparent nausea or vomiting Anesthetic complications: no   No complications documented.  Last Vitals:  Vitals:   10/02/19 0800 10/02/19 0815  BP: 137/90 (!) 139/91  Pulse: (!) 102 95  Resp: 15 14  Temp:    SpO2: 100% 100%    Last Pain:  Vitals:   10/02/19 0757  PainSc: 0-No pain                 Lannie Fields

## 2019-10-02 NOTE — Transfer of Care (Signed)
Immediate Anesthesia Transfer of Care Note  Patient: Danny Miller  Procedure(s) Performed: LAPAROSCOPY PERFORATED ULCER (N/A Abdomen)  Patient Location: PACU  Anesthesia Type:General  Level of Consciousness: awake, alert  and oriented  Airway & Oxygen Therapy: Patient Spontanous Breathing and Patient connected to face mask oxygen  Post-op Assessment: Report given to RN and Post -op Vital signs reviewed and stable  Post vital signs: Reviewed and stable  Last Vitals:  Vitals Value Taken Time  BP 137/90 10/02/19 0800  Temp    Pulse 102 10/02/19 0800  Resp 15 10/02/19 0800  SpO2 100 % 10/02/19 0800  Vitals shown include unvalidated device data.  Last Pain:  Vitals:   10/02/19 0530  PainSc: Asleep         Complications: No complications documented.

## 2019-10-02 NOTE — Anesthesia Preprocedure Evaluation (Addendum)
Anesthesia Evaluation  Patient identified by MRN, date of birth, ID band Patient awake    Reviewed: Allergy & Precautions, NPO status , Patient's Chart, lab work & pertinent test results  Airway Mallampati: II  TM Distance: >3 FB Neck ROM: Full    Dental no notable dental hx.    Pulmonary neg pulmonary ROS,    Pulmonary exam normal breath sounds clear to auscultation       Cardiovascular negative cardio ROS   Rhythm:Regular Rate:Tachycardia     Neuro/Psych negative neurological ROS  negative psych ROS   GI/Hepatic PUD, (+)     substance abuse  alcohol use,   Endo/Other  negative endocrine ROS  Renal/GU negative Renal ROS  negative genitourinary   Musculoskeletal negative musculoskeletal ROS (+)   Abdominal   Peds negative pediatric ROS (+)  Hematology negative hematology ROS (+)   Anesthesia Other Findings   Reproductive/Obstetrics negative OB ROS                            Anesthesia Physical Anesthesia Plan  ASA: II and emergent  Anesthesia Plan: General   Post-op Pain Management:    Induction: Intravenous and Rapid sequence  PONV Risk Score and Plan: 2 and Ondansetron, Dexamethasone and Treatment may vary due to age or medical condition  Airway Management Planned: Oral ETT  Additional Equipment:   Intra-op Plan:   Post-operative Plan: Extubation in OR  Informed Consent: I have reviewed the patients History and Physical, chart, labs and discussed the procedure including the risks, benefits and alternatives for the proposed anesthesia with the patient or authorized representative who has indicated his/her understanding and acceptance.     Dental advisory given  Plan Discussed with: CRNA and Surgeon  Anesthesia Plan Comments:        Anesthesia Quick Evaluation

## 2019-10-02 NOTE — Op Note (Addendum)
10/02/2019  7:56 AM  PATIENT:  Danny Miller  39 y.o. male  Patient Care Team: Patient, No Pcp Per as PCP - General (General Practice)  PRE-OPERATIVE DIAGNOSIS:  Perforated Ulcer  POST-OPERATIVE DIAGNOSIS:   Perforated prepyloric ulcer Steatohepatitis, alcoholic  PROCEDURE:   DIAGNOSTIC LAPAROSCOPY WITH WASHOUT  LAPAROSCOPIC OMENTAL GRAHAM PATCH OF PERFORATED ULCER  SURGEON:  Ardeth Sportsman, MD  ASSISTANT: OR Staff   ANESTHESIA:   local and general  EBL:  Total I/O In: 100 [IV Piggyback:100] Out: 50 [Blood:50]  Delay start of Pharmacological VTE agent (>24hrs) due to surgical blood loss or risk of bleeding:  no  DRAINS: none   SPECIMEN:  No Specimen  DISPOSITION OF SPECIMEN:  N/A  COUNTS:  YES  PLAN OF CARE: Admit to inpatient   PATIENT DISPOSITION:  PACU - guarded condition.  INDICATION:   Patient with evidence of free air and perforation.  Suspicion of perforated ulcer.  I recommended surgery:  The anatomy & physiology of the digestive tract was discussed.  The pathophysiology of perforation was discussed.  Differential diagnosis such as perforated ulcer or colon, etc was discussed.   Natural history risks without surgery such as death was discussed.  I recommended abdominal exploration to diagnose & treat the source of the problem.  Laparoscopic & open techniques were discussed.   Risks such as bleeding, infection, abscess, leak, reoperation, bowel resection, possible ostomy, hernia, heart attack, death, and other risks were discussed.   The risks of no intervention will lead to serious problems including death.   I expressed a good likelihood that surgery will address the problem.    Goals of post-operative recovery were discussed as well.  We will work to minimize complications although risks in an emergent setting are high.   Questions were answered.  The patient expressed understanding & wishes to proceed with surgery.      OR FINDINGS:   Perforated ulcer at  the distal antrum prepyloric.  Omental patch done.  CASE DATA:  Type of patient?: LDOW CASE (Surgical Hospitalist [facility] Inpatient)  Status of Case? EMERGENT Add On  Infection Present At Time Of Surgery (PATOS)?  PURULENCE massive infective biliary purulent ascites/peritonitis  DESCRIPTION:   Informed consent was confirmed.  The patient underwent general anaesthesia without difficulty.  The patient was positioned appropriately.  VTE prevention in place.  The patient's abdomen was clipped, prepped, & draped in a sterile fashion.  Surgical timeout confirmed our plan.  The patient was positioned in reverse Trendelenburg.  Abdominal entry with a 87mm port was gained using Varress & then optical entry technique in the left upper abdomen.  Entry was clean.  I induced carbon dioxide insufflation.  Camera inspection revealed no injury.  Extra ports were carefully placed under direct laparoscopic visualization.  Patient had phlegmon in 10 purulent ascites in the entire abdomen, especially in the right upper quadrant and pelvis.  Did aspiration of this and washout.  I freed the transverse colon out of the left upper quadrant by going through the lesser sac at the distal greater curvature the stomach and mobilized the transverse colon from the middle towards the hepatic flexure.  Without I could better expose the enlarged dilated stomach and antrum.  I freed the gallbladder infundibulum off of its adhesions to the duodenal bulb.  Patient had a very large floppy liver with steatohepatitis.  Eventually got a liver retractor and to better elevate and expose the region.  With this I was able to find a  punched-out perforated ulcer on the anterior surface of the distal antrum close to the pylorus..  About 5 mm in size.  Did irrigation over 4 L.  This I could aspirate and washing up the phlegmon and purulent ascites.  With better washout I can confirm the opening.  I found a healthy swath of greater omentum coming off  the distal greater curvature the stomach.  I help free it off circumferentially so that I could reach up and cover it.    I laid it over the perforated ulcer.  I proceeded to do an omental patch by placing 2-0 Vlock suture starting at the lesser curvature of the stomach just distal to the ulcer.  Then 30 omentum.  Then on the greater curvature side of the ulcer.  I then ran that back over several times to allow a good omental plug to patch up the ulcer in a classic Graham patch.  The stomach was still somewhat dilated.  I then anesthesia pulled back and readvanced the NG tube I placed on tension.  Got the nasogastric tube to run along the greater curvature stomach with the tip going more into the distal antrum.  With that the stomach was well decompressed.  Anesthesia tapered.  We did copious irrigation and reinspection of the entire abdominal cavity.  There is no frank evidence of appendicitis or diverticulitis.  No evidence of a perforated Meckel's diverticulum.  We carefully mobilized the omentum and small bowel, focusing along the stomach and duodenal sweep.  There were no more loculations.  After serial aliquots of 10 L of irrigation, the abdomen was more clean.  Much less of phlegmon irritation.  Omental patch rested well.  Hemostasis ensured.  Omental liver retractor removed without difficulty.  We then evacuated carbon dioxide to remove the ports.  I closed the ports using Monocryl stitch a sterile dressings.  Patient extubated.  In recovery room.  Anticipate he will need antibiotics for at least 5 days.  He is very high risk for postoperative abscess given the massive phlegmon and purulent ascites from his perforation.  Placed on IV Protonix drip for 72 hours and then switch to twice daily and eventually oral per protocol.  Most likely the patient would benefit from endoscopy EGD in 6-8 weeks to prove ulcer has healed.  I made an attempt to locate family to discuss patient's status and recommendations.   I tried to contact the patient's mother, going camping.  I got no answer.  No one is available at this time.  We will try again later  Ardeth Sportsman, M.D., F.A.C.S. Gastrointestinal and Minimally Invasive Surgery [facility] [address], [address] [place], [place] [address] [phone] Main / Paging

## 2019-10-02 NOTE — Anesthesia Procedure Notes (Signed)
Procedure Name: Intubation Date/Time: 10/02/2019 5:58 AM Performed by: Vanessa [place], CRNA Pre-anesthesia Checklist: Patient identified, Emergency Drugs available, Suction available and Patient being monitored Patient Re-evaluated:Patient Re-evaluated prior to induction Oxygen Delivery Method: Circle system utilized Preoxygenation: Pre-oxygenation with 100% oxygen Induction Type: IV induction, Rapid sequence and Cricoid Pressure applied Laryngoscope Size: 2 and Miller Grade View: Grade I Tube type: Oral Tube size: 7.5 mm Number of attempts: 1 Airway Equipment and Method: Stylet Placement Confirmation: ETT inserted through vocal cords under direct vision,  positive ETCO2 and breath sounds checked- equal and bilateral Secured at: 21 cm Tube secured with: Tape Dental Injury: Teeth and Oropharynx as per pre-operative assessment

## 2019-10-02 NOTE — Progress Notes (Signed)
A consult was received from an ED physician for cefepime per pharmacy dosing.  The patient's profile has been reviewed for ht/wt/allergies/indication/available labs.   A one time order has been placed for Cefepime 2gm iv x1.  Further antibiotics/pharmacy consults should be ordered by admitting physician if indicated.                       Thank you, Aleene Davidson Crowford 10/02/2019  1:25 AM

## 2019-10-03 ENCOUNTER — Encounter (HOSPITAL_COMMUNITY): Payer: Self-pay | Admitting: Surgery

## 2019-10-03 LAB — CBC
HCT: 40.4 % (ref 39.0–52.0)
Hemoglobin: 13.5 g/dL (ref 13.0–17.0)
MCH: 33.2 pg (ref 26.0–34.0)
MCHC: 33.4 g/dL (ref 30.0–36.0)
MCV: 99.3 fL (ref 80.0–100.0)
Platelets: 334 10*3/uL (ref 150–400)
RBC: 4.07 MIL/uL — ABNORMAL LOW (ref 4.22–5.81)
RDW: 13 % (ref 11.5–15.5)
WBC: 8.2 10*3/uL (ref 4.0–10.5)
nRBC: 0 % (ref 0.0–0.2)

## 2019-10-03 LAB — BASIC METABOLIC PANEL
Anion gap: 8 (ref 5–15)
BUN: 8 mg/dL (ref 6–20)
CO2: 25 mmol/L (ref 22–32)
Calcium: 8.4 mg/dL — ABNORMAL LOW (ref 8.9–10.3)
Chloride: 98 mmol/L (ref 98–111)
Creatinine, Ser: 0.71 mg/dL (ref 0.61–1.24)
GFR calc Af Amer: >60 mL/min (ref >60)
GFR calc non Af Amer: >60 mL/min (ref >60)
Glucose, Bld: 125 mg/dL — ABNORMAL HIGH (ref 70–99)
Potassium: 4.3 mmol/L (ref 3.5–5.1)
Sodium: 131 mmol/L — ABNORMAL LOW (ref 135–145)

## 2019-10-03 LAB — MAGNESIUM: Magnesium: 1.7 mg/dL (ref 1.7–2.4)

## 2019-10-03 MED ORDER — METOPROLOL TARTRATE 5 MG/5ML IV SOLN
5.0000 mg | Freq: Four times a day (QID) | INTRAVENOUS | Status: DC | PRN
  Administered 2019-10-03 – 2019-10-07 (×3): 5 mg via INTRAVENOUS
  Filled 2019-10-03 (×3): qty 5

## 2019-10-03 MED ORDER — PROPOFOL 10 MG/ML IV BOLUS
INTRAVENOUS | Status: DC | PRN
  Administered 2019-10-02: 170 mg via INTRAVENOUS

## 2019-10-03 NOTE — TOC Initial Note (Addendum)
Transition of Care [facility]) - Initial/Assessment Note    Patient Details  Name: Danny Miller MRN: [id number] Date of Birth: Dec 03, 1980  [facility] CM/SW Contact:    Lia Hopping, [place] Phone Number: 10/03/2019, 4:11 PM  Clinical Narrative:                 CSW met with the patient at bedside to discuss SA and establish a PCP. Patient guarded in response.Patient agreeable to [facility]. Patient not interested in treatment. Patient reports, "I am off it now." Patient agreeable to SA resources in a packet to review later.  CSW will provide packet and arrange a follow up appointment closer to discharge.   Expected Discharge Plan: Home/Self Care Barriers to Discharge: No Barriers Identified   Patient Goals and CMS Choice     Choice offered to / list presented to : NA  Expected Discharge Plan and Services Expected Discharge Plan: Home/Self Care In-house Referral: NA Discharge Planning Services: Follow-up appt scheduled, Medication Assistance Post Acute Care Choice: NA Living arrangements for the past 2 months: Single Family Home                 DME Arranged: N/A DME Agency: NA       HH Arranged: NA HH Agency: NA        Prior Living Arrangements/Services Living arrangements for the past 2 months: Single Family Home Lives with:: Self Patient language and need for interpreter reviewed:: No Do you feel safe going back to the place where you live?: Yes      Need for Family Participation in Patient Care: Yes (Comment) Care giver support system in place?: Yes (comment) Current home services: DME Criminal Activity/Legal Involvement Pertinent to Current Situation/Hospitalization: No - Comment as needed  Activities of Daily Living Home Assistive Devices/Equipment: None ADL Screening (condition at time of admission) Patient's cognitive ability adequate to safely complete daily activities?: Yes Is the patient deaf or have difficulty hearing?: No Does  the patient have difficulty seeing, even when wearing glasses/contacts?: No Does the patient have difficulty concentrating, remembering, or making decisions?: No Patient able to express need for assistance with ADLs?: Yes Does the patient have difficulty dressing or bathing?: No Independently performs ADLs?: Yes (appropriate for developmental age) Does the patient have difficulty walking or climbing stairs?: No Weakness of Legs: None Weakness of Arms/Hands: None  Permission Sought/Granted   Permission granted to share information with : Yes, Verbal Permission Granted     Permission granted to share info w AGENCY: [place] [facility]        Emotional Assessment Appearance:: Appears stated age Attitude/Demeanor/Rapport: Guarded Affect (typically observed): Flat Orientation: : Oriented to Self, Oriented to Place, Oriented to  Time, Oriented to Situation Alcohol / Substance Use: Not Applicable Psych Involvement: No (comment)  Admission diagnosis:  Perforated ulcer ([place]) [K27.5] Perforated gastric ulcer ([place]) [K25.5] Patient Active Problem List   Diagnosis Date Noted  . Prepyloric gastric ulcer with perforation s/p lap omental Graham patch 10/02/2019 10/02/2019  . Alcohol consumption binge drinking 10/02/2019  . History of nicotine vaping 10/02/2019  . Hypokalemia 10/02/2019  . Hypomagnesemia 10/02/2019  . Perforated gastric ulcer ([place]) 10/02/2019  . Steatohepatitis, alcoholic [id number]/9476   PCP:  Patient, No Pcp Per Pharmacy:   [facility] [address] [place], [place] [address] Phone: [phone] Fax: [phone]  Social Determinants of Health (SDOH) Interventions    Readmission Risk Interventions No flowsheet data found.

## 2019-10-03 NOTE — Progress Notes (Signed)
[facility] Progress Note  1 Day Post-Op  Subjective: Patient reports some abdominal pain but doing well with pain control. Discussed plans for UGI in a few days. Patient not passing any flatus yet. Discussed importance of ambulation.   Objective: Vital signs in last 24 hours: Temp:  [98.3 F (36.8 C)-99.5 F (37.5 C)] 98.4 F (36.9 C) (07/02 0941) Pulse Rate:  [97-110] 99 (07/02 0941) Resp:  [12-18] 18 (07/02 0941) BP: (136-161)/(87-103) 142/103 (07/02 0941) SpO2:  [91 %-100 %] 91 % (07/02 0941) Last BM Date: 10/01/19  Intake/Output from previous day: 07/01 0701 - 07/02 0700 In: 2865 [P.O.:300; I.V.:1178.3; IV Piggyback:1386.7] Out: 4050 [Urine:3450; Emesis/NG output:550; Blood:50] Intake/Output this shift: No intake/output data recorded.  PE: General: pleasant, WD, WN white male who is laying in bed in NAD Heart: regular, rate, and rhythm.  Normal s1,s2. No obvious murmurs, gallops, or rubs noted.  Palpable radial and pedal pulses bilaterally Lungs: CTAB, no wheezes, rhonchi, or rales noted.  Respiratory effort nonlabored Abd: soft, appropriately ttp, moderately distended, BS hypoactive, NGT present with thin bilious drainage, laparoscopic dressings in place    Lab Results:  Recent Labs    10/01/19 2114 10/03/19 0437  WBC 10.5 8.2  HGB 15.9 13.5  HCT 47.1 40.4  PLT 405* 334   BMET Recent Labs    10/01/19 2114 10/03/19 0437  NA 131* 131*  K 3.4* 4.3  CL 94* 98  CO2 22 25  GLUCOSE 160* 125*  BUN 10 8  CREATININE 0.70 0.71  CALCIUM 9.0 8.4*   PT/INR Recent Labs    10/02/19 0107  LABPROT 15.4*  INR 1.3*   CMP     Component Value Date/Time   NA 131 (L) 10/03/2019 0437   K 4.3 10/03/2019 0437   CL 98 10/03/2019 0437   CO2 25 10/03/2019 0437   GLUCOSE 125 (H) 10/03/2019 0437   BUN 8 10/03/2019 0437   CREATININE 0.71 10/03/2019 0437   CALCIUM 8.4 (L) 10/03/2019 0437   PROT 7.8 10/01/2019 2114   ALBUMIN 4.5 10/01/2019 2114   AST 28  10/01/2019 2114   ALT 17 10/01/2019 2114   ALKPHOS 82 10/01/2019 2114   BILITOT 0.7 10/01/2019 2114   GFRNONAA >60 10/03/2019 0437   GFRAA >60 10/03/2019 0437   Lipase     Component Value Date/Time   LIPASE 29 10/01/2019 2114       Studies/Results: CT ABDOMEN PELVIS W CONTRAST  Result Date: 10/02/2019 CLINICAL DATA:  Nausea and vomiting EXAM: CT ABDOMEN AND PELVIS WITH CONTRAST TECHNIQUE: Multidetector CT imaging of the abdomen and pelvis was performed using the standard protocol following bolus administration of intravenous contrast. CONTRAST:  OMNIPAQUE IOHEXOL 300 MG/ML  SOLN COMPARISON:  None. FINDINGS: LOWER CHEST: Normal. HEPATOBILIARY: Normal hepatic contours. No intra- or extrahepatic biliary dilatation. There is fluid and free air adjacent to the gallbladder. PANCREAS: Normal pancreas. No ductal dilatation or peripancreatic fluid collection. SPLEEN: Normal. ADRENALS/URINARY TRACT: The adrenal glands are normal. No hydronephrosis, nephroureterolithiasis or solid renal mass. The urinary bladder is normal for degree of distention STOMACH/BOWEL: There is small volume pneumoperitoneum, evident along the anterior hepatic margin and in the gallbladder fossa. There is hyperemia of the gastroduodenal junction wall with an apparent defect along the ventral aspect (axial image 36; sagittal images 77 and 78). There is moderate free fluid within the abdomen. The small bowel within the lower abdomen is hyperenhancing. No colonic abnormality. Appendix is normal. VASCULAR/LYMPHATIC: Normal course and caliber of the  major abdominal vessels. No abdominal or pelvic lymphadenopathy. REPRODUCTIVE: Normal prostate size with symmetric seminal vesicles. MUSCULOSKELETAL. No bony spinal canal stenosis or focal osseous abnormality. OTHER: None. IMPRESSION: 1. Small volume pneumoperitoneum and free intraperitoneal fluid, likely secondary to perforated gastroduodenal ulcer (3:67, 6:77). 2. Hyperenhancement of  the small bowel within the lower abdomen, likely reactive. And inflammatory or infectious enteritis would be difficult to exclude. Critical Value/emergent results were called by telephone at the time of interpretation on 10/02/2019 at 12:42 am to Dr. Ross Marcus , who verbally acknowledged these results. Electronically Signed   By: Deatra Robinson M.D.   On: 10/02/2019 00:42    Anti-infectives: Anti-infectives (From admission, onward)   Start     Dose/Rate Route Frequency Ordered Stop   10/03/19 0200  ceFEPIme (MAXIPIME) 2 g in sodium chloride 0.9 % 100 mL IVPB     Discontinue     2 g 200 mL/hr over 30 Minutes Intravenous Every 8 hours 10/02/19 1826 10/07/19 0159   10/02/19 1830  ceFEPIme (MAXIPIME) 2 g in sodium chloride 0.9 % 100 mL IVPB        2 g 200 mL/hr over 30 Minutes Intravenous  Once 10/02/19 1827 10/02/19 2049   10/02/19 1030  ceFEPIme (MAXIPIME) 2 g in sodium chloride 0.9 % 100 mL IVPB  Status:  Discontinued        2 g 200 mL/hr over 30 Minutes Intravenous Every 8 hours 10/02/19 1004 10/02/19 1826   10/02/19 1030  metroNIDAZOLE (FLAGYL) IVPB 500 mg     Discontinue     500 mg 100 mL/hr over 60 Minutes Intravenous Every 8 hours 10/02/19 1004 10/07/19 1029   10/02/19 0600  clindamycin (CLEOCIN) IVPB 900 mg       "And" Linked Group Details   900 mg 100 mL/hr over 30 Minutes Intravenous On call to O.R. 10/02/19 0124 10/03/19 0559   10/02/19 0600  gentamicin (GARAMYCIN) 340 mg in dextrose 5 % 100 mL IVPB       "And" Linked Group Details   5 mg/kg  68 kg 108.5 mL/hr over 60 Minutes Intravenous On call to O.R. 10/02/19 0124 10/02/19 0640   10/02/19 0355  clindamycin (CLEOCIN) 900 MG/50ML IVPB       Note to Pharmacy: Kevan Ny   : cabinet override      10/02/19 0355 10/02/19 0530   10/02/19 0130  ceFEPIme (MAXIPIME) 2 g in sodium chloride 0.9 % 100 mL IVPB        2 g 200 mL/hr over 30 Minutes Intravenous  Once 10/02/19 0125 10/02/19 0424   10/02/19 0100  metroNIDAZOLE (FLAGYL)  IVPB 500 mg        500 mg 100 mL/hr over 60 Minutes Intravenous  Once 10/02/19 0045 10/02/19 0425       Assessment/Plan  Perforated pre-pyloric ulcer S/p laparoscopic omental [name] patch 10/02/19 Dr. Michaell Cowing - POD#1 - continue NGT on LIWS and NPO, will plan UGI likely Monday to assess repair  - mobilize - H. Pylori pending - repeat labs in AM  FEN: NPO, IVF, NGT to LIWS VTE: SCDs, lovenox ID: [email or web address] 7/1>>   LOS: 1 day    Danny Miller , [facility] 10/03/2019, 10:02 AM Please see Amion for pager number during day hours 7:00am-4:30pm

## 2019-10-04 NOTE — Progress Notes (Signed)
[facility] Progress Note  2 Days Post-Op  Subjective: Patient reports some abdominal pain but doing well with pain control.  Objective: Vital signs in last 24 hours: Temp:  [98.2 F (36.8 C)-98.6 F (37 C)] 98.2 F (36.8 C) (07/03 0430) Pulse Rate:  [92-101] 98 (07/03 0430) Resp:  [16-18] 16 (07/03 0430) BP: (142-163)/(95-106) 150/106 (07/03 0430) SpO2:  [91 %-98 %] 98 % (07/03 0430) Last BM Date: 10/01/19  Intake/Output from previous day: 07/02 0701 - 07/03 0700 In: 2628.9 [I.V.:2228.9; IV Piggyback:400] Out: 2800 [Urine:1550; Emesis/NG output:1250] Intake/Output this shift: No intake/output data recorded.  PE: General: pleasant, WD, WN white male who is laying in bed in NAD Abd: soft, appropriately ttp, moderately distended, NGT present with clear drainage, laparoscopic dressings in place    Lab Results:  Recent Labs    10/01/19 2114 10/03/19 0437  WBC 10.5 8.2  HGB 15.9 13.5  HCT 47.1 40.4  PLT 405* 334   BMET Recent Labs    10/01/19 2114 10/03/19 0437  NA 131* 131*  K 3.4* 4.3  CL 94* 98  CO2 22 25  GLUCOSE 160* 125*  BUN 10 8  CREATININE 0.70 0.71  CALCIUM 9.0 8.4*   PT/INR Recent Labs    10/02/19 0107  LABPROT 15.4*  INR 1.3*   CMP     Component Value Date/Time   NA 131 (L) 10/03/2019 0437   K 4.3 10/03/2019 0437   CL 98 10/03/2019 0437   CO2 25 10/03/2019 0437   GLUCOSE 125 (H) 10/03/2019 0437   BUN 8 10/03/2019 0437   CREATININE 0.71 10/03/2019 0437   CALCIUM 8.4 (L) 10/03/2019 0437   PROT 7.8 10/01/2019 2114   ALBUMIN 4.5 10/01/2019 2114   AST 28 10/01/2019 2114   ALT 17 10/01/2019 2114   ALKPHOS 82 10/01/2019 2114   BILITOT 0.7 10/01/2019 2114   GFRNONAA >60 10/03/2019 0437   GFRAA >60 10/03/2019 0437   Lipase     Component Value Date/Time   LIPASE 29 10/01/2019 2114       Studies/Results: No results found.  Anti-infectives: Anti-infectives (From admission, onward)   Start     Dose/Rate Route  Frequency Ordered Stop   10/03/19 0200  ceFEPIme (MAXIPIME) 2 g in sodium chloride 0.9 % 100 mL IVPB     Discontinue     2 g 200 mL/hr over 30 Minutes Intravenous Every 8 hours 10/02/19 1826 10/07/19 0159   10/02/19 1830  ceFEPIme (MAXIPIME) 2 g in sodium chloride 0.9 % 100 mL IVPB        2 g 200 mL/hr over 30 Minutes Intravenous  Once 10/02/19 1827 10/02/19 2049   10/02/19 1030  ceFEPIme (MAXIPIME) 2 g in sodium chloride 0.9 % 100 mL IVPB  Status:  Discontinued        2 g 200 mL/hr over 30 Minutes Intravenous Every 8 hours 10/02/19 1004 10/02/19 1826   10/02/19 1030  metroNIDAZOLE (FLAGYL) IVPB 500 mg     Discontinue     500 mg 100 mL/hr over 60 Minutes Intravenous Every 8 hours 10/02/19 1004 10/07/19 1029   10/02/19 0600  clindamycin (CLEOCIN) IVPB 900 mg       "And" Linked Group Details   900 mg 100 mL/hr over 30 Minutes Intravenous On call to O.R. 10/02/19 0124 10/03/19 0559   10/02/19 0600  gentamicin (GARAMYCIN) 340 mg in dextrose 5 % 100 mL IVPB       "And" Linked Group Details  5 mg/kg  68 kg 108.5 mL/hr over 60 Minutes Intravenous On call to O.R. 10/02/19 0124 10/02/19 0640   10/02/19 0355  clindamycin (CLEOCIN) 900 MG/50ML IVPB       Note to Pharmacy: Kevan Ny   : cabinet override      10/02/19 0355 10/02/19 0530   10/02/19 0130  ceFEPIme (MAXIPIME) 2 g in sodium chloride 0.9 % 100 mL IVPB        2 g 200 mL/hr over 30 Minutes Intravenous  Once 10/02/19 0125 10/02/19 0424   10/02/19 0100  metroNIDAZOLE (FLAGYL) IVPB 500 mg        500 mg 100 mL/hr over 60 Minutes Intravenous  Once 10/02/19 0045 10/02/19 0425       Assessment/Plan  Perforated pre-pyloric ulcer S/p laparoscopic omental [name] patch 10/02/19 Dr. Michaell Cowing - POD#2 - continue NGT on LIWS and NPO, will plan UGI likely Tues to assess repair  - mobilize - H. Pylori pending   FEN: NPO, IVF, NGT to LIWS VTE: SCDs, lovenox ID: [email or web address] 7/1>>   LOS: 2 days    Vanita Panda , MD [facility] 10/04/2019, 9:14 AM Please see Amion for pager number during day hours 7:00am-4:30pm

## 2019-10-05 MED ORDER — PHENOL 1.4 % MT LIQD: 2.0000 | OROMUCOSAL | Status: DC | PRN

## 2019-10-05 NOTE — Progress Notes (Signed)
[facility] Progress Note  3 Days Post-Op  Subjective: Patient reports some abdominal pain but mostly having pain from NG.  Objective: Vital signs in last 24 hours: Temp:  [98 F (36.7 C)-98.9 F (37.2 C)] 98.9 F (37.2 C) (07/04 0503) Pulse Rate:  [77-82] 79 (07/04 0503) Resp:  [14-16] 16 (07/04 0503) BP: (152-161)/(97-102) 161/97 (07/04 0503) SpO2:  [99 %-100 %] 99 % (07/04 0503) Last BM Date: 10/01/19  Intake/Output from previous day: 07/03 0701 - 07/04 0700 In: 3311.5 [P.O.:60; I.V.:2488.1; IV Piggyback:763.3] Out: 1400 [Urine:750; Emesis/NG output:650] Intake/Output this shift: No intake/output data recorded.  PE: General: pleasant, WD, WN white male who is laying in bed in NAD Abd: soft, appropriately ttp, moderately distended, NGT present with clear drainage, laparoscopic dressings in place    Lab Results:  Recent Labs    10/03/19 0437  WBC 8.2  HGB 13.5  HCT 40.4  PLT 334   BMET Recent Labs    10/03/19 0437  NA 131*  K 4.3  CL 98  CO2 25  GLUCOSE 125*  BUN 8  CREATININE 0.71  CALCIUM 8.4*   PT/INR No results for input(s): LABPROT, INR in the last 72 hours. CMP     Component Value Date/Time   NA 131 (L) 10/03/2019 0437   K 4.3 10/03/2019 0437   CL 98 10/03/2019 0437   CO2 25 10/03/2019 0437   GLUCOSE 125 (H) 10/03/2019 0437   BUN 8 10/03/2019 0437   CREATININE 0.71 10/03/2019 0437   CALCIUM 8.4 (L) 10/03/2019 0437   PROT 7.8 10/01/2019 2114   ALBUMIN 4.5 10/01/2019 2114   AST 28 10/01/2019 2114   ALT 17 10/01/2019 2114   ALKPHOS 82 10/01/2019 2114   BILITOT 0.7 10/01/2019 2114   GFRNONAA >60 10/03/2019 0437   GFRAA >60 10/03/2019 0437   Lipase     Component Value Date/Time   LIPASE 29 10/01/2019 2114       Studies/Results: No results found.  Anti-infectives: Anti-infectives (From admission, onward)   Start     Dose/Rate Route Frequency Ordered Stop   10/03/19 0200  ceFEPIme (MAXIPIME) 2 g in sodium chloride  0.9 % 100 mL IVPB     Discontinue     2 g 200 mL/hr over 30 Minutes Intravenous Every 8 hours 10/02/19 1826 10/07/19 0159   10/02/19 1830  ceFEPIme (MAXIPIME) 2 g in sodium chloride 0.9 % 100 mL IVPB        2 g 200 mL/hr over 30 Minutes Intravenous  Once 10/02/19 1827 10/02/19 2049   10/02/19 1030  ceFEPIme (MAXIPIME) 2 g in sodium chloride 0.9 % 100 mL IVPB  Status:  Discontinued        2 g 200 mL/hr over 30 Minutes Intravenous Every 8 hours 10/02/19 1004 10/02/19 1826   10/02/19 1030  metroNIDAZOLE (FLAGYL) IVPB 500 mg     Discontinue     500 mg 100 mL/hr over 60 Minutes Intravenous Every 8 hours 10/02/19 1004 10/07/19 1029   10/02/19 0600  clindamycin (CLEOCIN) IVPB 900 mg       "And" Linked Group Details   900 mg 100 mL/hr over 30 Minutes Intravenous On call to O.R. 10/02/19 0124 10/03/19 0559   10/02/19 0600  gentamicin (GARAMYCIN) 340 mg in dextrose 5 % 100 mL IVPB       "And" Linked Group Details   5 mg/kg  68 kg 108.5 mL/hr over 60 Minutes Intravenous On call to O.R. 10/02/19 0124 10/02/19 4174  10/02/19 0355  clindamycin (CLEOCIN) 900 MG/50ML IVPB       Note to Pharmacy: Kevan Ny   : cabinet override      10/02/19 0355 10/02/19 0530   10/02/19 0130  ceFEPIme (MAXIPIME) 2 g in sodium chloride 0.9 % 100 mL IVPB        2 g 200 mL/hr over 30 Minutes Intravenous  Once 10/02/19 0125 10/02/19 0424   10/02/19 0100  metroNIDAZOLE (FLAGYL) IVPB 500 mg        500 mg 100 mL/hr over 60 Minutes Intravenous  Once 10/02/19 0045 10/02/19 0425       Assessment/Plan  Perforated pre-pyloric ulcer S/p laparoscopic omental [name] patch 10/02/19 Dr. Michaell Cowing - POD#3 - continue NGT on LIWS and NPO, will plan UGI likely Tues to assess repair  - mobilize - H. Pylori pending   FEN: NPO, IVF, NGT to LIWS VTE: SCDs, lovenox ID: [email or web address] 7/1>>   LOS: 3 days    Vanita Panda , MD [facility] 10/05/2019, 9:03 AM Please see Amion for pager number during day hours  7:00am-4:30pm

## 2019-10-06 LAB — CBC
HCT: 34.4 % — ABNORMAL LOW (ref 39.0–52.0)
Hemoglobin: 11.4 g/dL — ABNORMAL LOW (ref 13.0–17.0)
MCH: 32.5 pg (ref 26.0–34.0)
MCHC: 33.1 g/dL (ref 30.0–36.0)
MCV: 98 fL (ref 80.0–100.0)
Platelets: 330 10*3/uL (ref 150–400)
RBC: 3.51 MIL/uL — ABNORMAL LOW (ref 4.22–5.81)
RDW: 12.4 % (ref 11.5–15.5)
WBC: 6.5 10*3/uL (ref 4.0–10.5)
nRBC: 0 % (ref 0.0–0.2)

## 2019-10-06 LAB — BASIC METABOLIC PANEL
Anion gap: 14 (ref 5–15)
BUN: 6 mg/dL (ref 6–20)
CO2: 20 mmol/L — ABNORMAL LOW (ref 22–32)
Calcium: 8.4 mg/dL — ABNORMAL LOW (ref 8.9–10.3)
Chloride: 99 mmol/L (ref 98–111)
Creatinine, Ser: 0.56 mg/dL — ABNORMAL LOW (ref 0.61–1.24)
GFR calc Af Amer: >60 mL/min (ref >60)
GFR calc non Af Amer: >60 mL/min (ref >60)
Glucose, Bld: 92 mg/dL (ref 70–99)
Potassium: 3.2 mmol/L — ABNORMAL LOW (ref 3.5–5.1)
Sodium: 133 mmol/L — ABNORMAL LOW (ref 135–145)

## 2019-10-06 MED ORDER — SODIUM CHLORIDE 0.9 % IV SOLN
INTRAVENOUS | Status: DC | PRN
  Administered 2019-10-06: 250 mL via INTRAVENOUS

## 2019-10-06 NOTE — Progress Notes (Signed)
[facility] Progress Note  4 Days Post-Op  Subjective: No new complaints.  Objective: Vital signs in last 24 hours: Temp:  [97.8 F (36.6 C)-98.6 F (37 C)] 98.2 F (36.8 C) (07/05 0558) Pulse Rate:  [76-88] 88 (07/05 0558) Resp:  [15-16] 16 (07/05 0558) BP: (142-166)/(99-106) 142/106 (07/05 0558) SpO2:  [98 %-100 %] 99 % (07/05 0558) Last BM Date: 10/01/19  Intake/Output from previous day: 07/04 0701 - 07/05 0700 In: 934.6 [I.V.:634.6; IV Piggyback:300] Out: 1550 [Urine:900; Emesis/NG output:650] Intake/Output this shift: No intake/output data recorded.  PE: General: pleasant, WD, WN white male who is lying in bed in NAD Abd: soft, appropriately ttp, non-distended, NGT present with clear drainage, laparoscopic dressings in place    Lab Results:  Recent Labs    10/06/19 0600  WBC 6.5  HGB 11.4*  HCT 34.4*  PLT 330   BMET Recent Labs    10/06/19 0600  NA 133*  K 3.2*  CL 99  CO2 20*  GLUCOSE 92  BUN 6  CREATININE 0.56*  CALCIUM 8.4*   PT/INR No results for input(s): LABPROT, INR in the last 72 hours. CMP     Component Value Date/Time   NA 133 (L) 10/06/2019 0600   K 3.2 (L) 10/06/2019 0600   CL 99 10/06/2019 0600   CO2 20 (L) 10/06/2019 0600   GLUCOSE 92 10/06/2019 0600   BUN 6 10/06/2019 0600   CREATININE 0.56 (L) 10/06/2019 0600   CALCIUM 8.4 (L) 10/06/2019 0600   PROT 7.8 10/01/2019 2114   ALBUMIN 4.5 10/01/2019 2114   AST 28 10/01/2019 2114   ALT 17 10/01/2019 2114   ALKPHOS 82 10/01/2019 2114   BILITOT 0.7 10/01/2019 2114   GFRNONAA >60 10/06/2019 0600   GFRAA >60 10/06/2019 0600   Lipase     Component Value Date/Time   LIPASE 29 10/01/2019 2114       Studies/Results: No results found.  Anti-infectives: Anti-infectives (From admission, onward)   Start     Dose/Rate Route Frequency Ordered Stop   10/03/19 0200  ceFEPIme (MAXIPIME) 2 g in sodium chloride 0.9 % 100 mL IVPB     Discontinue     2 g 200 mL/hr over  30 Minutes Intravenous Every 8 hours 10/02/19 1826 10/07/19 0159   10/02/19 1830  ceFEPIme (MAXIPIME) 2 g in sodium chloride 0.9 % 100 mL IVPB        2 g 200 mL/hr over 30 Minutes Intravenous  Once 10/02/19 1827 10/02/19 2049   10/02/19 1030  ceFEPIme (MAXIPIME) 2 g in sodium chloride 0.9 % 100 mL IVPB  Status:  Discontinued        2 g 200 mL/hr over 30 Minutes Intravenous Every 8 hours 10/02/19 1004 10/02/19 1826   10/02/19 1030  metroNIDAZOLE (FLAGYL) IVPB 500 mg     Discontinue     500 mg 100 mL/hr over 60 Minutes Intravenous Every 8 hours 10/02/19 1004 10/07/19 1029   10/02/19 0600  clindamycin (CLEOCIN) IVPB 900 mg       "And" Linked Group Details   900 mg 100 mL/hr over 30 Minutes Intravenous On call to O.R. 10/02/19 0124 10/03/19 0559   10/02/19 0600  gentamicin (GARAMYCIN) 340 mg in dextrose 5 % 100 mL IVPB       "And" Linked Group Details   5 mg/kg  68 kg 108.5 mL/hr over 60 Minutes Intravenous On call to O.R. 10/02/19 0124 10/02/19 0640   10/02/19 0355  clindamycin (CLEOCIN) 900  MG/50ML IVPB       Note to Pharmacy: Kevan Ny   : cabinet override      10/02/19 0355 10/02/19 0530   10/02/19 0130  ceFEPIme (MAXIPIME) 2 g in sodium chloride 0.9 % 100 mL IVPB        2 g 200 mL/hr over 30 Minutes Intravenous  Once 10/02/19 0125 10/02/19 0424   10/02/19 0100  metroNIDAZOLE (FLAGYL) IVPB 500 mg        500 mg 100 mL/hr over 60 Minutes Intravenous  Once 10/02/19 0045 10/02/19 0425       Assessment/Plan  Perforated pre-pyloric ulcer S/p laparoscopic omental [name] patch 10/02/19 Dr. Michaell Cowing - POD#4 - continue NGT on LIWS and NPO, will plan UGI likely tomorrow to assess repair  - mobilize - H. Pylori pending   FEN: NPO, IVF, NGT to LIWS VTE: SCDs, lovenox ID: [email or web address] 7/1>>   LOS: 4 days    Vanita Panda , MD [facility] 10/06/2019, 8:46 AM Please see Amion for pager number during day hours 7:00am-4:30pm

## 2019-10-07 ENCOUNTER — Inpatient Hospital Stay (HOSPITAL_COMMUNITY): Payer: Self-pay

## 2019-10-07 LAB — H. PYLORI ANTIBODY, IGG: H Pylori IgG: 0.13 Index Value (ref 0.00–0.79)

## 2019-10-07 MED ORDER — IOHEXOL 300 MG/ML  SOLN
50.0000 mL | Freq: Once | INTRAMUSCULAR | Status: AC | PRN
  Administered 2019-10-07: 50 mL

## 2019-10-07 NOTE — Progress Notes (Signed)
NGT removed. Pt tolerated well.

## 2019-10-07 NOTE — Progress Notes (Signed)
5 Days Post-Op    CC: Abdominal pain  Subjective: He just completed his upper GI.  They told him it looked good downstairs.  His port sites all look good pain has resolved.  Objective: Vital signs in last 24 hours: Temp:  [98.1 F (36.7 C)-98.9 F (37.2 C)] 98.1 F (36.7 C) (07/06 0635) Pulse Rate:  [76-79] 77 (07/06 0635) Resp:  [16-18] 16 (07/06 0635) BP: (106-156)/(94-98) 154/98 (07/06 0635) SpO2:  [98 %] 98 % (07/06 0635) Last BM Date: 10/07/19 120 p.o. 1598 IV 3650 urine 400 NG No BM recorded Afebrile vital signs are stable Sodium 133, potassium 3.2, magnesium yesterday was 1.7 WBC 6.5;  H/H 15.9 /47.1  >> 11.4/34.4  Intake/Output from previous day: 07/05 0701 - 07/06 0700 In: 1598.4 [P.O.:120; I.V.:1078.4; IV Piggyback:400] Out: 4050 [Urine:3650; Emesis/NG output:400] Intake/Output this shift: Total I/O In: 30 [P.O.:30] Out: 600 [Emesis/NG output:600]  General appearance: alert, cooperative and no distress Resp: clear to auscultation bilaterally GI: Soft, port sites dressings are all clean and dry.  Positive bowel sounds NG in place.  Lab Results:  Recent Labs    10/06/19 0600  WBC 6.5  HGB 11.4*  HCT 34.4*  PLT 330    BMET Recent Labs    10/06/19 0600  NA 133*  K 3.2*  CL 99  CO2 20*  GLUCOSE 92  BUN 6  CREATININE 0.56*  CALCIUM 8.4*   PT/INR No results for input(s): LABPROT, INR in the last 72 hours.  Recent Labs  Lab 10/01/19 2114  AST 28  ALT 17  ALKPHOS 82  BILITOT 0.7  PROT 7.8  ALBUMIN 4.5     Lipase     Component Value Date/Time   LIPASE 29 10/01/2019 2114     Medications:  enoxaparin (LOVENOX) injection  40 mg Subcutaneous Q24H   lip balm  1 application Topical BID   pantoprazole  40 mg Intravenous Q12H   sodium chloride flush  3 mL Intravenous Once   thiamine  100 mg Oral Daily   Or   thiamine  100 mg Intravenous Daily    sodium chloride     sodium chloride 250 mL (10/06/19 1010)   lactated ringers  100 mL/hr at 10/06/19 1800   magnesium sulfate bolus IVPB     methocarbamol (ROBAXIN) IV     ondansetron (ZOFRAN) IV     Assessment/Plan Alcoholic steatohepatitis Hx alcoholic binge drinking Hx vaping   Perforated prepyloric ulcer Diagnostic laparoscopy with washout, laparoscopic omental [name] patch of a perforated ulcer 10/02/2019 Dr. Karie Soda POD #5  FEN: IV fluids/n.p.o. ID: Cefepime 6/30-7/5; Flagyl 7/1>> day 6 DVT: Lovenox Follow-up Dr. Michaell Cowing  Plan: If his upper GI is negative we will plan to pull his NG and start him on clear liquids.  LOS: 5 days    Zelma Mazariego 10/07/2019 Please see Amion

## 2019-10-07 NOTE — TOC Progression Note (Addendum)
Transition of Care [facility]) - Progression Note    Patient Details  Name: Danny Miller MRN: [id number] Date of Birth: 1981/02/01  [facility] CM/SW Contact  Clearance Coots, LCSW Phone Number: 10/07/2019, 10:22 AM  Clinical Narrative:    Primary Care appointment scheduled at [facility], July 29 th at 2:00pm. Information written on AVS  CSW visited patient again, left SA resources at bedside and notified the patient of his scheduled appointment.   Expected Discharge Plan: Home/Self Care Barriers to Discharge: No Barriers Identified  Expected Discharge Plan and Services Expected Discharge Plan: Home/Self Care In-house Referral: NA Discharge Planning Services: Follow-up appt scheduled, Medication Assistance Post Acute Care Choice: NA Living arrangements for the past 2 months: Single Family Home                 DME Arranged: N/A DME Agency: NA       HH Arranged: NA HH Agency: NA         Social Determinants of Health (SDOH) Interventions    Readmission Risk Interventions No flowsheet data found.

## 2019-10-08 MED ORDER — POTASSIUM CHLORIDE CRYS ER 20 MEQ PO TBCR
20.0000 meq | EXTENDED_RELEASE_TABLET | Freq: Two times a day (BID) | ORAL | Status: DC
  Administered 2019-10-08: 20 meq via ORAL
  Filled 2019-10-08: qty 1

## 2019-10-08 MED ORDER — POTASSIUM CHLORIDE CRYS ER 20 MEQ PO TBCR: 20.0000 meq | EXTENDED_RELEASE_TABLET | Freq: Every day | ORAL | 0 refills | Status: DC

## 2019-10-08 MED ORDER — OXYCODONE HCL 5 MG PO TABS: 5.0000 mg | ORAL_TABLET | ORAL | Status: DC | PRN

## 2019-10-08 MED ORDER — HYDROCHLOROTHIAZIDE 25 MG PO TABS: 25.0000 mg | ORAL_TABLET | Freq: Every day | ORAL | Status: DC

## 2019-10-08 MED ORDER — PANTOPRAZOLE SODIUM 40 MG PO TBEC
40.0000 mg | DELAYED_RELEASE_TABLET | Freq: Two times a day (BID) | ORAL | Status: DC
  Administered 2019-10-08: 40 mg via ORAL
  Filled 2019-10-08: qty 1

## 2019-10-08 MED ORDER — HYDROCHLOROTHIAZIDE 12.5 MG PO CAPS
12.5000 mg | ORAL_CAPSULE | Freq: Every day | ORAL | Status: DC
  Administered 2019-10-08: 12.5 mg via ORAL
  Filled 2019-10-08: qty 1

## 2019-10-08 MED ORDER — ACETAMINOPHEN 325 MG PO TABS: 650.0000 mg | ORAL_TABLET | Freq: Four times a day (QID) | ORAL | Status: DC | PRN

## 2019-10-08 MED ORDER — ACETAMINOPHEN 325 MG PO TABS: 650.0000 mg | ORAL_TABLET | Freq: Four times a day (QID) | ORAL | Status: AC | PRN

## 2019-10-08 MED ORDER — HYDROCHLOROTHIAZIDE 25 MG PO TABS: 25.0000 mg | ORAL_TABLET | Freq: Every day | ORAL | 0 refills | Status: DC

## 2019-10-08 MED ORDER — METHOCARBAMOL 500 MG PO TABS: 500.0000 mg | ORAL_TABLET | Freq: Three times a day (TID) | ORAL | Status: DC | PRN

## 2019-10-08 MED ORDER — OXYCODONE HCL 5 MG PO TABS: ORAL_TABLET | ORAL | 0 refills | Status: DC

## 2019-10-08 MED ORDER — HYDROMORPHONE HCL 1 MG/ML IJ SOLN: 0.5000 mg | INTRAMUSCULAR | Status: DC | PRN

## 2019-10-08 MED ORDER — PANTOPRAZOLE SODIUM 40 MG PO TBEC: 40.0000 mg | DELAYED_RELEASE_TABLET | Freq: Two times a day (BID) | ORAL | 1 refills | Status: DC

## 2019-10-08 NOTE — Discharge Summary (Signed)
Physician Discharge Summary  Patient ID: Danny Miller MRN: [id number] DOB/AGE: 10/22/1980 39 y.o.  Admit date: 10/01/2019 Discharge date: 10/08/2019  Admission Diagnoses:  Perforated duodenal ulcer Alcohol consumption binge drinking Hx of nicotine vaping   Discharge Diagnoses:  Perforated prepyloric ulcer Alcoholic steatohepatitis Hx alcoholic binge drinking Hx vaping Hypertension - untreated  Principal Problem:   Prepyloric gastric ulcer with perforation s/p lap omental [name] patch 10/02/2019 Active Problems:   Alcohol consumption binge drinking   History of nicotine vaping   Hypokalemia   Hypomagnesemia   Perforated gastric ulcer (HCC)   Steatohepatitis, alcoholic   PROCEDURES: Diagnostic laparoscopy with washout, laparoscopic omental [name] patch of a perforated ulcer 10/02/2019 Dr. Kalman Jewels Course:  39 year old male.  History of alcohol drinking rather heavily.  6-8 drinks a night.  Noted severe abdominal pain today.  Gradually worsened and intensified.  Became unbearable.  Nauseated with decreased appetite.  No emesis.  Came emergency room.  Concern for abdominal peritonitis.  CT scan shows dots of pneumoperitoneum in the upper abdomen with focus most likely around the gastroduodenal junction.  Highly suspicious for perforated ulcer.  Patient will have some thickened small intestinal loops.  Patient denies any dietary changes or sick contacts.  He has never had any prior abdominal surgery.  He vapes but does not smoke tobacco.  Denies any heart or lung issues.  Normally can walk a couple miles without difficulty.  Normally moves his bowels a couple times a day  No personal nor family history of GI/colon cancer, inflammatory bowel disease, irritable bowel syndrome, allergy such as Celiac Sprue, dietary/dairy problems, colitis, ulcers nor gastritis.  No recent sick contacts/gastroenteritis.  No travel outside the country.  No changes in diet.  No dysphagia to solids  or liquids.  No significant heartburn or reflux.  No hematochezia, hematemesis, coffee ground emesis.  No evidence of prior gastric/peptic ulceration.  Pt seen in the ED by DR. Gross and taken directly to the OR.  He underwent procedure as described above.  Pt tolerated the procedure well.   He had a fairly smooth post op course.  He underwent post op UGI on 10/07/19 that showed no leak from his ulcer.  NG was removed and he was started on clears.  He was advanced to a soft diet on 10/08/19.  Antibiotics were discontinued.  He was maintained on IV Protonix post op BID, this was converted to PO on 10/08/19.  He had fairly high blood pressures thru out his post op course.  He says he has known his BP was up for about a year.  He has never seen anyone about this and has never been treated for it.  BP in the hospital 150-160's/90's-100's.  He was treated with intermittent IV lopressor, while NPO, we started him on HCTZ 12.5 on 7/7, and increased it to 25 mg at discharge.  He has no primary care and we already have an appointment for follow up at the [facility] center, post op.    Port sites are all healing nicely.  He was ready for discharge in the PM 10/08/19.  We discussed his BP and treatment.  He knows to follow up with PCP and appointment for the Wellness center is already made for him.    CBC Latest Ref Rng & Units 10/06/2019 10/03/2019 10/01/2019  WBC 4.0 - 10.5 K/uL 6.5 8.2 10.5  Hemoglobin 13.0 - 17.0 g/dL 11.4(L) 13.5 15.9  Hematocrit 39 - 52 % 34.4(L) 40.4 47.1  Platelets 150 - 400 K/uL 330 334 405(H)     CBC Latest Ref Rng & Units 10/06/2019 10/03/2019 10/01/2019  WBC 4.0 - 10.5 K/uL 6.5 8.2 10.5  Hemoglobin 13.0 - 17.0 g/dL 11.4(L) 13.5 15.9  Hematocrit 39 - 52 % 34.4(L) 40.4 47.1  Platelets 150 - 400 K/uL 330 334 405(H)   CMP Latest Ref Rng & Units 10/06/2019 10/03/2019 10/01/2019  Glucose 70 - 99 mg/dL 92 357(S) 177(L)  BUN 6 - 20 mg/dL 6 8 10   Creatinine 0.61 - 1.24 mg/dL ) 3.90(Z 0.09  Sodium  135 - 145 mmol/L 133(L) 131(L) 131(L)  Potassium 3.5 - 5.1 mmol/L 3.2(L) 4.3 3.4(L)  Chloride 98 - 111 mmol/L 99 98 94(L)  CO2 22 - 32 mmol/L 20(L) 25 22  Calcium 8.9 - 10.3 mg/dL 2.33) 0.0(T) 9.0  Total Protein 6.5 - 8.1 g/dL - - 7.8  Total Bilirubin 0.3 - 1.2 mg/dL - - 0.7  Alkaline Phos 38 - 126 U/L - - 82  AST 15 - 41 U/L - - 28  ALT 0 - 44 U/L - - 17   Pathology still pending.  H Pylori is negative/HIV negative/COVID was also negative    Disposition: Discharge disposition: 01-Home or Self Care     home   Discharge Instructions    Diet - low sodium heart healthy   Complete by: As directed    Increase activity slowly   Complete by: As directed    No lifting over 20 pounds for 2 weeks     Allergies as of 10/08/2019      Reactions   Nsaids Other (See Comments)   Perforated ulcer   Penicillins       Medication List    TAKE these medications   acetaminophen 325 MG tablet Commonly known as: TYLENOL Take 2 tablets (650 mg total) by mouth every 6 (six) hours as needed for mild pain, moderate pain or headache.   hydrochlorothiazide 25 MG tablet Commonly known as: HYDRODIURIL Take 1 tablet (25 mg total) by mouth daily. Start taking on: October 09, 2019   oxyCODONE 5 MG immediate release tablet Commonly known as: Oxy IR/ROXICODONE You can take one tablet every 6 hours as needed for pain not relieved by Tylenol/acetaminophen.   pantoprazole 40 MG tablet Commonly known as: PROTONIX Take 1 tablet (40 mg total) by mouth 2 (two) times daily.   potassium chloride SA 20 MEQ tablet Commonly known as: KLOR-CON Take 1 tablet (20 mEq total) by mouth daily.       Follow-up Information    [facility]. Go to.   Why: Appointment scheduled for July 29th @ 2:00pm. Please arrive 15 minutes early to complete new patient forms.  Contact information: [address] [place] [phone]  He is to bring his BP  readings with him for follow up at their office.  He will need follow up labs during this visit also.         [phone], MD Follow up on 11/05/2019.   Specialty: General Surgery Why: Your appointment is at 4:30 PM.  Be at the office 30 minutes early for check in. Bring photo ID and insurance information. Contact information: [address] [place] [phone]               Signed: [phone] 10/08/2019, 4:27 PM

## 2019-10-08 NOTE — Progress Notes (Signed)
Discharge instructions given to patient and all questions were answered.  

## 2019-10-08 NOTE — Discharge Instructions (Signed)
STOP ALL ALCOHOL  AVOID NSAID MEDICATIONS (NO IBUPROFEN, NAPROXEN, ASPIRIN)  SURGERY: POST OP INSTRUCTIONS (Surgery for perforated ulcer)   ######################################################################  EAT Gradually transition to a high fiber diet with a fiber supplement over the next few days after discharge  WALK Walk an hour a day.  Control your pain to do that.    CONTROL PAIN Control pain so that you can walk, sleep, tolerate sneezing/coughing, go up/down stairs.  HAVE A BOWEL MOVEMENT DAILY Keep your bowels regular to avoid problems.  OK to try a laxative to override constipation.  OK to use an antidairrheal to slow down diarrhea.  Call if not better after 2 tries  CALL IF YOU HAVE PROBLEMS/CONCERNS Call if you are still struggling despite following these instructions. Call if you have concerns not answered by these instructions  ######################################################################   DIET Follow a light diet the first few days at home.  Start with a bland diet such as soups, liquids, starchy foods, low fat foods, etc.  If you feel full, bloated, or constipated, stay on a ful liquid or pureed/blenderized diet for a few days until you feel better and no longer constipated. Be sure to drink plenty of fluids every day to avoid getting dehydrated (feeling dizzy, not urinating, etc.). Gradually add a fiber supplement to your diet over the next week.  Gradually get back to a regular solid diet.  Avoid fast food or heavy meals the first week as you are more likely to get nauseated. It is expected for your digestive tract to need a few months to get back to normal.  It is common for your bowel movements and stools to be irregular.  You will have occasional bloating and cramping that should eventually fade away.  Until you are eating solid food normally, off all pain medications, and back to regular activities; your bowels will not be normal. Focus on eating  a low-fat, high fiber diet the rest of your life (See Getting to Good Bowel Health, below).  CARE of your INCISION or WOUND It is good for closed incision and even open wounds to be washed every day.  Shower every day.  Short baths are fine.  Wash the incisions and wounds clean with soap & water.    If you have a closed incision(s), wash the incision with soap & water every day.  You may leave closed incisions open to air if it is dry.   You may cover the incision with clean gauze & replace it after your daily shower for comfort. If you have skin tapes (Steristrips) or skin glue (Dermabond) on your incision, leave them in place.  They will fall off on their own like a scab.  You may trim any edges that curl up with clean scissors.  If you have staples, set up an appointment for them to be removed in the office in 10 days after surgery.  If you have a drain, wash around the skin exit site with soap & water and place a new dressing of gauze or band aid around the skin every day.  Keep the drain site clean & dry.    If you have an open wound with packing, see wound care instructions.  In general, it is encouraged that you remove your dressing and packing, shower with soap & water, and replace your dressing once a day.  Pack the wound with clean gauze moistened with normal (0.9%) saline to keep the wound moist & uninfected.  Pressure on the  dressing for 30 minutes will stop most wound bleeding.  Eventually your body will heal & pull the open wound closed over the next few months.  Raw open wounds will occasionally bleed or secrete yellow drainage until it heals closed.  Drain sites will drain a little until the drain is removed.  Even closed incisions can have mild bleeding or drainage the first few days until the skin edges scab over & seal.   If you have an open wound with a wound vac, see wound vac care instructions.     ACTIVITIES as tolerated Start light daily activities --- self-care, walking,  climbing stairs-- beginning the day after surgery.  Gradually increase activities as tolerated.  Control your pain to be active.  Stop when you are tired.  Ideally, walk several times a day, eventually an hour a day.   Most people are back to most day-to-day activities in a few weeks.  It takes 4-8 weeks to get back to unrestricted, intense activity. If you can walk 30 minutes without difficulty, it is safe to try more intense activity such as jogging, treadmill, bicycling, low-impact aerobics, swimming, etc. Save the most intensive and strenuous activity for last (Usually 4-8 weeks after surgery) such as sit-ups, heavy lifting, contact sports, etc.  Refrain from any intense heavy lifting or straining until you are off narcotics for pain control.  You will have off days, but things should improve week-by-week. DO NOT PUSH THROUGH PAIN.  Let pain be your guide: If it hurts to do something, don't do it.  Pain is your body warning you to avoid that activity for another week until the pain goes down. You may drive when you are no longer taking narcotic prescription pain medication, you can comfortably wear a seatbelt, and you can safely make sudden turns/stops to protect yourself without hesitating due to pain. You may have sexual intercourse when it is comfortable. If it hurts to do something, stop.  MEDICATIONS Take your usually prescribed home medications unless otherwise directed.   Blood thinners:  Usually you can restart any strong blood thinners after the second postoperative day.  It is OK to take aspirin right away.     If you are on strong blood thinners (warfarin/Coumadin, Plavix, Xerelto, Eliquis, Pradaxa, etc), discuss with your surgeon, medicine PCP, and/or cardiologist for instructions on when to restart the blood thinner & if blood monitoring is needed (PT/INR blood check, etc).     PAIN CONTROL Pain after surgery or related to activity is often due to strain/injury to muscle, tendon,  nerves and/or incisions.  This pain is usually short-term and will improve in a few months.  To help speed the process of healing and to get back to regular activity more quickly, DO THE FOLLOWING THINGS TOGETHER: 1. Increase activity gradually.  DO NOT PUSH THROUGH PAIN 2. Use Ice and/or Heat 3. Try Gentle Massage and/or Stretching 4. Take over the counter pain medication 5. Take Narcotic prescription pain medication for more severe pain  Good pain control = faster recovery.  It is better to take more medicine to be more active than to stay in bed all day to avoid medications. 1.  Increase activity gradually Avoid heavy lifting at first, then increase to lifting as tolerated over the next 6 weeks. Do not "push through" the pain.  Listen to your body and avoid positions and maneuvers than reproduce the pain.  Wait a few days before trying something more intense Walking an hour a day  is encouraged to help your body recover faster and more safely.  Start slowly and stop when getting sore.  If you can walk 30 minutes without stopping or pain, you can try more intense activity (running, jogging, aerobics, cycling, swimming, treadmill, sex, sports, weightlifting, etc.) Remember: If it hurts to do it, then don't do it! 2. Use Ice and/or Heat You will have swelling and bruising around the incisions.  This will take several weeks to resolve. Ice packs or heating pads (6-8 times a day, 30-60 minutes at a time) will help sooth soreness & bruising. Some people prefer to use ice alone, heat alone, or alternate between ice & heat.  Experiment and see what works best for you.  Consider trying ice for the first few days to help decrease swelling and bruising; then, switch to heat to help relax sore spots and speed recovery. Shower every day.  Short baths are fine.  It feels good!  Keep the incisions and wounds clean with soap & water.   3. Try Gentle Massage and/or Stretching Massage at the area of pain many  times a day Stop if you feel pain - do not overdo it 4. Take over the counter pain medication This helps the muscle and nerve tissues become less irritable and calm down faster Choose ONE of the following over-the-counter anti-inflammatory medications: Acetaminophen  tabs (Tylenol) 1-2 pills with every meal and just before bedtime (avoid if you have liver problems or if you have acetaminophen in you narcotic prescription) Naproxen  tabs (ex. Aleve, Naprosyn) 1-2 pills twice a day (avoid if you have kidney, stomach, IBD, or bleeding problems) Ibuprofen  tabs (ex. Advil, Motrin) 3-4 pills with every meal and just before bedtime (avoid if you have kidney, stomach, IBD, or bleeding problems) Take with food/snack several times a day as directed for at least 2 weeks to help keep pain / soreness down & more manageable. 5. Take Narcotic prescription pain medication for more severe pain A prescription for strong pain control is often given to you upon discharge (for example: oxycodone/Percocet, hydrocodone/Norco/Vicodin, or tramadol/Ultram) Take your pain medication as prescribed. Be mindful that most narcotic prescriptions contain Tylenol (acetaminophen) as well - avoid taking too much Tylenol. If you are having problems/concerns with the prescription medicine (does not control pain, nausea, vomiting, rash, itching, etc.), please call us [phone] to see if we need to switch you to a different pain medicine that will work better for you and/or control your side effects better. If you need a refill on your pain medication, you must call the office before 4 pm and on weekdays only.  By federal law, prescriptions for narcotics cannot be called into a pharmacy.  They must be filled out on paper & picked up from our office by the patient or authorized caretaker.  Prescriptions cannot be filled after 4 pm nor on weekends.    WHEN TO CALL us [phone] Severe uncontrolled or worsening pain   Fever over 101 F (38.5 C) Concerns with the incision: Worsening pain, redness, rash/hives, swelling, bleeding, or drainage Reactions / problems with new medications (itching, rash, hives, nausea, etc.) Nausea and/or vomiting Difficulty urinating Difficulty breathing Worsening fatigue, dizziness, lightheadedness, blurred vision Other concerns If you are not getting better after two weeks or are noticing you are getting worse, contact our office [phone] for further advice.  We may need to adjust your medications, re-evaluate you in the office, send you to the emergency room, or see what  other things we can do to help. The clinic staff is available to answer your questions during regular business hours (8:30am-5pm).  Please don't hesitate to call and ask to speak to one of our nurses for clinical concerns.    A surgeon from [facility] is always on call at the hospitals 24 hours/day If you have a medical emergency, go to the nearest emergency room or call 911.  FOLLOW UP in our office One the day of your discharge from the hospital (or the next business weekday), please call [facility] to set up or confirm an appointment to see your surgeon in the office for a follow-up appointment.  Usually it is 2-3 weeks after your surgery.   If you have skin staples at your incision(s), let the office know so we can set up a time in the office for the nurse to remove them (usually around 10 days after surgery). Make sure that you call for appointments the day of discharge (or the next business weekday) from the hospital to ensure a convenient appointment time. IF YOU HAVE DISABILITY OR FAMILY LEAVE FORMS, BRING THEM TO THE OFFICE FOR PROCESSING.  DO NOT GIVE THEM TO YOUR DOCTOR.  [facility] [address], [address], [place], [place]  [address] ? [phone] - Main [phone] - Toll Free,  [phone] -  Fax [email or web address]  GETTING TO GOOD BOWEL HEALTH. It is expected for your digestive tract to need a few months to get back to normal.  It is common for your bowel movements and stools to be irregular.  You will have occasional bloating and cramping that should eventually fade away.  Until you are eating solid food normally, off all pain medications, and back to regular activities; your bowels will not be normal.   Avoiding constipation The goal: ONE SOFT BOWEL MOVEMENT A DAY!    Drink plenty of fluids.  Choose water first. TAKE A FIBER SUPPLEMENT EVERY DAY THE REST OF YOUR LIFE During your first week back home, gradually add back a fiber supplement every day Experiment which form you can tolerate.   There are many forms such as powders, tablets, wafers, gummies, etc Psyllium bran (Metamucil), methylcellulose (Citrucel), Miralax or Glycolax, Benefiber, Flax Seed.  Adjust the dose week-by-week (1/2 dose/day to 6 doses a day) until you are moving your bowels 1-2 times a day.  Cut back the dose or try a different fiber product if it is giving you problems such as diarrhea or bloating. Sometimes a laxative is needed to help jump-start bowels if constipated until the fiber supplement can help regulate your bowels.  If you are tolerating eating & you are farting, it is okay to try a gentle laxative such as double dose MiraLax, prune juice, or Milk of Magnesia.  Avoid using laxatives too often. Stool softeners can sometimes help counteract the constipating effects of narcotic pain medicines.  It can also cause diarrhea, so avoid using for too long. If you are still constipated despite taking fiber daily, eating solids, and a few doses of laxatives, call our office. Controlling diarrhea Try drinking liquids and eating bland foods for a few days to avoid stressing your intestines further. Avoid dairy products (especially milk & ice cream) for a short time.  The intestines often can lose the  ability to digest lactose when stressed. Avoid foods that cause gassiness or bloating.  Typical foods include beans and other legumes, cabbage, broccoli, and dairy foods.  Avoid  greasy, spicy, fast foods.  Every person has some sensitivity to other foods, so listen to your body and avoid those foods that trigger problems for you. Probiotics (such as active yogurt, Align, etc) may help repopulate the intestines and colon with normal bacteria and calm down a sensitive digestive tract Adding a fiber supplement gradually can help thicken stools by absorbing excess fluid and retrain the intestines to act more normally.  Slowly increase the dose over a few weeks.  Too much fiber too soon can backfire and cause cramping & bloating. It is okay to try and slow down diarrhea with a few doses of antidiarrheal medicines.   Bismuth subsalicylate (ex. Kayopectate, Pepto Bismol) for a few doses can help control diarrhea.  Avoid if pregnant.   Loperamide (Imodium) can slow down diarrhea.  Start with one tablet ( ) first.  Avoid if you are having fevers or severe pain.  ILEOSTOMY PATIENTS WILL HAVE CHRONIC DIARRHEA since their colon is not in use.    Drink plenty of liquids.  You will need to drink even more glasses of water/liquid a day to avoid getting dehydrated. Record output from your ileostomy.  Expect to empty the bag every 3-4 hours at first.  Most people with a permanent ileostomy empty their bag 4-6 times at the least.   Use antidiarrheal medicine (especially Imodium) several times a day to avoid getting dehydrated.  Start with a dose at bedtime & breakfast.  Adjust up or down as needed.  Increase antidiarrheal medications as directed to avoid emptying the bag more than 8 times a day (every 3 hours). Work with your wound ostomy nurse to learn care for your ostomy.  See ostomy care instructions. TROUBLESHOOTING IRREGULAR BOWELS 1) Start with a soft & bland diet. No spicy, greasy, or fried foods.  2) Avoid  gluten/wheat or dairy products from diet to see if symptoms improve. 3) Miralax 17gm or flax seed mixed in 8oz. water or juice-daily. May use 2-4 times a day as needed. 4) Gas-X, Phazyme, etc. as needed for gas & bloating.  5) Prilosec (omeprazole) over-the-counter as needed 6)  Consider probiotics (Align, Activa, etc) to help calm the bowels down  Call your doctor if you are getting worse or not getting better.  Sometimes further testing (cultures, endoscopy, X-ray studies, CT scans, bloodwork, etc.) may be needed to help diagnose and treat the cause of the diarrhea. [facility] [address], [place], [place]  [address] [phone] - Main.    [phone]  - Toll Free.   [phone] - Fax [email or web address]    Peptic Ulcer You will need a endoscopic exam in 6-8 weeks by a gastroenterologist to be sure your ulcer has healed.  A peptic ulcer is a sore in the lining of the stomach (gastric ulcer) or the first part of the small intestine (duodenal ulcer). The ulcer causes a gradual wearing away (erosion) of the deeper tissue. What are the causes? Normally, the lining of the stomach and the small intestine protects them from the acid that digests food. The protective lining can be damaged by:  An infection caused by a type of bacteria called Helicobacter pylori or H. pylori.  Regular use of NSAIDs, such as ibuprofen or aspirin.  Rare tumors in the stomach, small intestine, or pancreas (Zollinger-Ellison syndrome). What increases the risk? The following factors may make you more likely to develop this condition:  Smoking.  Having a family history of ulcer disease.  Drinking  alcohol.  Having been hospitalized in an intensive care unit (ICU). What are the signs or symptoms? Symptoms of this condition include:  Persistent burning pain in the area between the chest and the belly button. The pain may be worse on an empty stomach and  at night.  Heartburn.  Nausea and vomiting.  Bloating. If the ulcer results in bleeding, it can cause:  Black, tarry stools.  Vomiting of bright red blood.  Vomiting of material that looks like coffee grounds. How is this diagnosed? This condition may be diagnosed based on:  Your medical history and a physical exam.  Various tests or procedures, such as: ? Blood tests, stool tests, or breath tests to check for the H. pylori bacteria. ? An X-ray exam (upper gastrointestinal series) of the esophagus, stomach, and small intestine. ? Upper endoscopy. The health care provider examines the esophagus, stomach, and small intestine using a small flexible tube that has a video camera at the end. ? Biopsy. A tissue sample is removed to be examined under a microscope. How is this treated? Treatment for this condition may include:  Eliminating the cause of the ulcer, such as smoking or use of NSAIDs, and limiting alcohol and caffeine intake.  Medicines to reduce the amount of acid in your digestive tract.  Antibiotic medicines, if the ulcer is caused by an H. pylori infection.  An upper endoscopy may be used to treat a bleeding ulcer.  Surgery. This may be needed if the bleeding is severe or if the ulcer created a hole somewhere in the digestive system. Follow these instructions at home:  Do not drink alcohol if your health care provider tells you not to drink.  Do not use any products that contain nicotine or tobacco, such as cigarettes, e-cigarettes, and chewing tobacco. If you need help quitting, ask your health care provider.  Take over-the-counter and prescription medicines only as told by your health care provider. ? Do not use over-the-counter medicines in place of prescription medicines unless your health care provider approves. ? Do not take aspirin, ibuprofen, or other NSAIDs unless your health care provider told you to do so.  Take over-the-counter and prescription  medicines only as told by your health care provider.  Keep all follow-up visits as told by your health care provider. This is important. Contact a health care provider if:  Your symptoms do not improve within 7 days of starting treatment.  You have ongoing indigestion or heartburn. Get help right away if:  You have sudden, sharp, or persistent pain in your abdomen.  You have bloody or dark black, tarry stools.  You vomit blood or material that looks like coffee grounds.  You become light-headed or you feel faint.  You become weak.  You become sweaty or clammy. Summary  A peptic ulcer is a sore in the lining of the stomach (gastric ulcer) or the first part of the small intestine (duodenal ulcer). The ulcer causes a gradual wearing away (erosion) of the deeper tissue.  Do not use any products that contain nicotine or tobacco, such as cigarettes, e-cigarettes, and chewing tobacco. If you need help quitting, ask your health care provider.  Take over-the-counter and prescription medicines only as told by your health care provider. Do not use over-the-counter medicines in place of prescription medicines unless your health care provider approves.  Contact your health care provider if you have ongoing indigestion or heartburn.  Keep all follow-up visits as told by your health care provider. This is  important. This information is not intended to replace advice given to you by your health care provider. Make sure you discuss any questions you have with your health care provider. Document Revised: 09/25/2017 Document Reviewed: 09/25/2017 Elsevier Patient Education  2020 [employer].   Hypertension, Adult You need to have your primary care doctor monitor your blood pressure and treat it.   Use your home blood pressure cuff to take your blood pressure at home, in the AM after you get up, lunch time and around supper time.  Record the date and time along with the blood pressure and take  with you to your primary care doctor so he can adjust your blood pressure medicines as needed.  Take your home machine to the office so they can compare your machine to theirs.   Hypertension is another name for high blood pressure. High blood pressure forces your heart to work harder to pump blood. This can cause problems over time. There are two numbers in a blood pressure reading. There is a top number (systolic) over a bottom number (diastolic). It is best to have a blood pressure that is below 120/80. Healthy choices can help lower your blood pressure, or you may need medicine to help lower it. What are the causes? The cause of this condition is not known. Some conditions may be related to high blood pressure. What increases the risk?  Smoking.  Having type 2 diabetes mellitus, high cholesterol, or both.  Not getting enough exercise or physical activity.  Being overweight.  Having too much fat, sugar, calories, or salt (sodium) in your diet.  Drinking too much alcohol.  Having long-term (chronic) kidney disease.  Having a family history of high blood pressure.  Age. Risk increases with age.  Race. You may be at higher risk if you are African American.  Gender. Men are at higher risk than women before age 46. After age 51, women are at higher risk than men.  Having obstructive sleep apnea.  Stress. What are the signs or symptoms?  High blood pressure may not cause symptoms. Very high blood pressure (hypertensive crisis) may cause: ? Headache. ? Feelings of worry or nervousness (anxiety). ? Shortness of breath. ? Nosebleed. ? A feeling of being sick to your stomach (nausea). ? Throwing up (vomiting). ? Changes in how you see. ? Very bad chest pain. ? Seizures. How is this treated?  This condition is treated by making healthy lifestyle changes, such as: ? Eating healthy foods. ? Exercising more. ? Drinking less alcohol.  Your health care provider may prescribe  medicine if lifestyle changes are not enough to get your blood pressure under control, and if: ? Your top number is above 130. ? Your bottom number is above 80.  Your personal target blood pressure may vary. Follow these instructions at home: Eating and drinking   If told, follow the DASH eating plan. To follow this plan: ? Fill one half of your plate at each meal with fruits and vegetables. ? Fill one fourth of your plate at each meal with whole grains. Whole grains include whole-wheat pasta, brown rice, and whole-grain bread. ? Eat or drink low-fat dairy products, such as skim milk or low-fat yogurt. ? Fill one fourth of your plate at each meal with low-fat (lean) proteins. Low-fat proteins include fish, chicken without skin, eggs, beans, and tofu. ? Avoid fatty meat, cured and processed meat, or chicken with skin. ? Avoid pre-made or processed food.  Eat less than 1,500  mg of salt each day.  Do not drink alcohol if: ? Your doctor tells you not to drink. ? You are pregnant, may be pregnant, or are planning to become pregnant.  If you drink alcohol: ? Limit how much you use to:  0-1 drink a day for women.  0-2 drinks a day for men. ? Be aware of how much alcohol is in your drink. In the [place] one drink equals one 12 oz bottle of beer (355 mL), one 5 oz glass of wine (148 mL), or one 1 oz glass of hard liquor (44 mL). Lifestyle   Work with your doctor to stay at a healthy weight or to lose weight. Ask your doctor what the best weight is for you.  Get at least 30 minutes of exercise most days of the week. This may include walking, swimming, or biking.  Get at least 30 minutes of exercise that strengthens your muscles (resistance exercise) at least 3 days a week. This may include lifting weights or doing Pilates.  Do not use any products that contain nicotine or tobacco, such as cigarettes, e-cigarettes, and chewing tobacco. If you need help quitting, ask your doctor.  Check  your blood pressure at home as told by your doctor.  Keep all follow-up visits as told by your doctor. This is important. Medicines  Take over-the-counter and prescription medicines only as told by your doctor. Follow directions carefully.  Do not skip doses of blood pressure medicine. The medicine does not work as well if you skip doses. Skipping doses also puts you at risk for problems.  Ask your doctor about side effects or reactions to medicines that you should watch for. Contact a doctor if you:  Think you are having a reaction to the medicine you are taking.  Have headaches that keep coming back (recurring).  Feel dizzy.  Have swelling in your ankles.  Have trouble with your vision. Get help right away if you:  Get a very bad headache.  Start to feel mixed up (confused).  Feel weak or numb.  Feel faint.  Have very bad pain in your: ? Chest. ? Belly (abdomen).  Throw up more than once.  Have trouble breathing. Summary  Hypertension is another name for high blood pressure.  High blood pressure forces your heart to work harder to pump blood.  For most people, a normal blood pressure is less than 120/80.  Making healthy choices can help lower blood pressure. If your blood pressure does not get lower with healthy choices, you may need to take medicine. This information is not intended to replace advice given to you by your health care provider. Make sure you discuss any questions you have with your health care provider. Document Revised: 11/28/2017 Document Reviewed: 11/28/2017 Elsevier Patient Education  2020 [employer]

## 2019-10-08 NOTE — Progress Notes (Addendum)
6 Days Post-Op    CC: Abdominal pain  Subjective: Patient is doing well from his perforated ulcer.  He says he has had high blood pressure for about a year that he is aware of.  Both his father and brother have high blood pressure.  He has never sought any treatment for this.  Port sites all look good.  He is tolerating full liquids well.  Objective: Vital signs in last 24 hours: Temp:  [98 F (36.7 C)-99.1 F (37.3 C)] 98.7 F (37.1 C) (07/07 0558) Pulse Rate:  [80-92] 80 (07/07 0558) Resp:  [18] 18 (07/07 0558) BP: (147-160)/(98-105) 155/98 (07/07 0558) SpO2:  [95 %-97 %] 95 % (07/07 0558) Last BM Date: 10/07/19 1020 PO 1660 IV Urine 400 Stool x 7 NG 750 Afebrile, VSS BP has been up K+ 3.2 WBC 6.5  Intake/Output from previous day: 07/06 0701 - 07/07 0700 In: 2680.1 [P.O.:1020; I.V.:1660.1] Out: 1150 [Urine:400; Emesis/NG output:750] Intake/Output this shift: No intake/output data recorded.  General appearance: alert, cooperative and no distress Resp: clear to auscultation bilaterally GI: Soft port sites all look good no distention or tenderness.  Lab Results:  Recent Labs    10/06/19 0600  WBC 6.5  HGB 11.4*  HCT 34.4*  PLT 330    BMET Recent Labs    10/06/19 0600  NA 133*  K 3.2*  CL 99  CO2 20*  GLUCOSE 92  BUN 6  CREATININE 0.56*  CALCIUM 8.4*   PT/INR No results for input(s): LABPROT, INR in the last 72 hours.  Recent Labs  Lab 10/01/19 2114  AST 28  ALT 17  ALKPHOS 82  BILITOT 0.7  PROT 7.8  ALBUMIN 4.5     Lipase     Component Value Date/Time   LIPASE 29 10/01/2019 2114     Medications:  enoxaparin (LOVENOX) injection  40 mg Subcutaneous Q24H   lip balm  1 application Topical BID   pantoprazole  40 mg Intravenous Q12H   sodium chloride flush  3 mL Intravenous Once   thiamine  100 mg Oral Daily   Or   thiamine  100 mg Intravenous Daily    sodium chloride     sodium chloride Stopped (10/07/19 1151)   lactated  ringers 100 mL/hr at 10/08/19 0126   magnesium sulfate bolus IVPB     methocarbamol (ROBAXIN) IV     ondansetron (ZOFRAN) IV      Assessment/Plan Alcoholic steatohepatitis Hx alcoholic binge drinking Hx vaping Hypertension - untreated  Perforated prepyloric ulcer Diagnostic laparoscopy with washout, laparoscopic omental [name] patch of a perforated ulcer 10/02/2019 Dr. Karie Soda POD #6 H Pylori is negative  FEN: IV fluids/n.p.o. ID: Cefepime 6/30-7/5; Flagyl 7/1-10/07/19 DVT: Lovenox Follow-up Dr. Michaell Cowing   Plan:  Convert him to PO medicines, add HCTZ for BP, replace K+.  Doing well with soft diet,  having some loose stools.  BP is about the same.  He would like to go home tonight.  I will increase his HCTZ to 25 mg daily and also send him home on potassium supplement.  We talked about measuring his BP at home and recording it.Marland Kitchen He can then share it with his PCP and help direct his treatment of his BP.  He understands he needs to get GI follow up and EGD in 6-8 weeks.    LOS: 6 days    Danny Miller 10/08/2019 Please see Amion

## 2019-10-13 LAB — H. PYLORI ANTIBODY, IGG: H Pylori IgG: 0.23 Index Value (ref 0.00–0.79)

## 2019-10-30 ENCOUNTER — Other Ambulatory Visit: Payer: Self-pay

## 2019-10-30 ENCOUNTER — Ambulatory Visit: Payer: Self-pay | Attending: Internal Medicine | Admitting: Internal Medicine

## 2019-10-30 ENCOUNTER — Encounter: Payer: Self-pay | Admitting: Internal Medicine

## 2019-10-30 VITALS — BP 114/77 | HR 72 | Resp 16 | Ht 69.0 in | Wt 153.4 lb

## 2019-10-30 DIAGNOSIS — F1021 Alcohol dependence, in remission: Secondary | ICD-10-CM

## 2019-10-30 DIAGNOSIS — Z09 Encounter for follow-up examination after completed treatment for conditions other than malignant neoplasm: Secondary | ICD-10-CM

## 2019-10-30 DIAGNOSIS — I10 Essential (primary) hypertension: Secondary | ICD-10-CM

## 2019-10-30 DIAGNOSIS — D649 Anemia, unspecified: Secondary | ICD-10-CM

## 2019-10-30 DIAGNOSIS — K251 Acute gastric ulcer with perforation: Secondary | ICD-10-CM

## 2019-10-30 MED ORDER — HYDROCHLOROTHIAZIDE 25 MG PO TABS: 25.0000 mg | ORAL_TABLET | Freq: Every day | ORAL | 4 refills | Status: DC

## 2019-10-30 MED ORDER — PANTOPRAZOLE SODIUM 40 MG PO TBEC: 40.0000 mg | DELAYED_RELEASE_TABLET | Freq: Two times a day (BID) | ORAL | 1 refills | Status: DC

## 2019-10-30 MED ORDER — POTASSIUM CHLORIDE CRYS ER 20 MEQ PO TBCR: 20.0000 meq | EXTENDED_RELEASE_TABLET | Freq: Every day | ORAL | 4 refills | Status: DC

## 2019-10-30 NOTE — Patient Instructions (Signed)
You should avoid taking aspirin, ibuprofen, Aleve, Advil or Naprosyn on a long-term basis.

## 2019-10-30 NOTE — Progress Notes (Signed)
Patient ID: Danny Miller, male    DOB: [birth year]  MRN: [id number]  CC: Hospitalization Follow-up   Subjective: Danny Miller is a 39 y.o. male who presents for new pt visit and hosp f/u. His concerns today include:   Patient with no previous PCP.  However he states that he has an appointment to establish with a PCP with [facility] in September.  Patient hospitalized 6/30-10/08/2019 with acute abdominal pain.  He was found to have a perforated duodenal ulcer.  Patient states he was taking 2 regular strength aspirins daily for back pain.  He also reported heavy alcohol use of 6-8 drinks a night.  He was found to have electrolyte abnormalities and elevated blood pressure.  Previous history of hypertension that was not treated.  Patient underwent lap omental Graham patch to repair the perforated ulcer.  He did well postop.  He was started on hydrochlorothiazide for blood pressure control.  He had a mild anemia postoperatively.  He was discharged home in a stable condition on Protonix 40 mg daily and oxycodone for pain.  Today: Patient reports that he is doing well.  He has not had any abdominal pain.  He is eating and tolerating regular diet.  No blood in the stools.  He has a follow-up appointment with the surgeon Dr. Michaell Cowing early next month.  Compliant with taking pantoprazole.  He will need refill.  He has not taken any aspirin since hospital discharge.  He states he was taking it for back pain prior to hospitalization for about 1 to 2 months.  He has not been bothered by back pain recently.  In regards to alcohol use, patient states he has quit completely.  He did have some cravings for the first few days after hospitalization but since then he is done well.  HTN: Reports compliance with hydrochlorothiazide and potassium.  He limits salt in the foods.  No chest pains or shortness of breath.  Past medical, social, family history reviewed. Patient Active Problem List   Diagnosis Date  Noted   Prepyloric gastric ulcer with perforation s/p lap omental Graham patch 10/02/2019 10/02/2019   Alcohol consumption binge drinking 10/02/2019   History of nicotine vaping 10/02/2019   Hypokalemia 10/02/2019   Hypomagnesemia 10/02/2019   Perforated gastric ulcer (HCC) 10/02/2019   Steatohepatitis, alcoholic 10/02/2019     Current Outpatient Medications on File Prior to Visit  Medication Sig Dispense Refill   acetaminophen (TYLENOL) 325 MG tablet Take 2 tablets (650 mg total) by mouth every 6 (six) hours as needed for mild pain, moderate pain or headache.     oxyCODONE (OXY IR/ROXICODONE) 5 MG immediate release tablet You can take one tablet every 6 hours as needed for pain not relieved by Tylenol/acetaminophen. 15 tablet 0   No current facility-administered medications on file prior to visit.    Allergies  Allergen Reactions   Nsaids Other (See Comments)    Perforated ulcer   Penicillins     Social History   Socioeconomic History   Marital status: Single    Spouse name: Not on file   Number of children: Not on file   Years of education: Not on file   Highest education level: Not on file  Occupational History   Not on file  Tobacco Use   Smoking status: Never Smoker   Smokeless tobacco: Never Used  Vaping Use   Vaping Use: Every day  Substance and Sexual Activity   Alcohol use: Yes   Drug  use: Never   Sexual activity: Not on file  Other Topics Concern   Not on file  Social History Narrative   Not on file   Social Determinants of Health   Financial Resource Strain:    Difficulty of Paying Living Expenses:   Food Insecurity:    Worried About Running Out of Food in the Last Year:    Barista in the Last Year:   Transportation Needs:    Freight forwarder (Medical):    Lack of Transportation (Non-Medical):   Physical Activity:    Days of Exercise per Week:    Minutes of Exercise per Session:   Stress:    Feeling  of Stress :   Social Connections:    Frequency of Communication with Friends and Family:    Frequency of Social Gatherings with Friends and Family:    Attends Religious Services:    Active Member of Clubs or Organizations:    Attends Engineer, structural:    Marital Status:   Intimate Partner Violence:    Fear of Current or Ex-Partner:    Emotionally Abused:    Physically Abused:    Sexually Abused:     No family history on file.  Past Surgical History:  Procedure Laterality Date   LAPAROSCOPY N/A 10/02/2019   Procedure: LAPAROSCOPY PERFORATED ULCER;  Surgeon: Karie Soda, MD;  Location: [facility];  Service: General;  Laterality: N/A;    ROS: Review of Systems Negative except as stated above  PHYSICAL EXAM: BP 114/77    Pulse 72    Resp 16    Ht 5\' 9"  (1.753 m)    Wt 153 lb 6.4 oz (69.6 kg)    SpO2 100%    BMI 22.65 kg/m   Physical Exam General appearance - alert, well appearing, young to middle-aged Caucasian male and in no distress Mental status - normal mood, behavior, speech, dress, motor activity, and thought processes Eyes -Pink conjunctiva  lymphatics -no cervical or axillary lymphadenopathy. Chest - clear to auscultation, no wheezes, rales or rhonchi, symmetric air entry Heart - normal rate, regular rhythm, normal S1, S2, no murmurs, rubs, clicks or gallops Abdomen - soft, nontender, nondistended, no masses or organomegaly Extremities -no lower extremity edema  CMP Latest Ref Rng & Units 10/06/2019 10/03/2019 10/01/2019  Glucose 70 - 99 mg/dL 92 10/03/2019) 157(W)  BUN 6 - 20 mg/dL 6 8 10   Creatinine 0.61 - 1.24 mg/dL 620(B) 5.59(R  Sodium 135 - 145 mmol/L 133(L) 131(L) 131(L)  Potassium 3.5 - 5.1 mmol/L 3.2(L) 4.3 3.4(L)  Chloride 98 - 111 mmol/L 99 98 94(L)  CO2 22 - 32 mmol/L 20(L) 25 22  Calcium 8.9 - 10.3 mg/dL 4.16) 3.84) 9.0  Total Protein 6.5 - 8.1 g/dL - - 7.8  Total Bilirubin 0.3 - 1.2 mg/dL - - 0.7  Alkaline Phos 38 - 126 U/L - - 82    AST 15 - 41 U/L - - 28  ALT 0 - 44 U/L - - 17   Lipid Panel  No results found for: CHOL, TRIG, HDL, CHOLHDL, VLDL, LDLCALC, LDLDIRECT  CBC    Component Value Date/Time   WBC 6.5 10/06/2019 0600   RBC 3.51 (L) 10/06/2019 0600   HGB 11.4 (L) 10/06/2019 0600   HCT 34.4 (L) 10/06/2019 0600   PLT 330 10/06/2019 0600   MCV 98.0 10/06/2019 0600   MCH 32.5 10/06/2019 0600   MCHC 33.1 10/06/2019 0600   RDW 12.4 10/06/2019 0600  ASSESSMENT AND PLAN: 1. Hospital discharge follow-up  2. Prepyloric gastric ulcer with perforation s/p lap omental Graham patch 10/02/2019 Patient to keep appointment with the surgeon next week. Advised to not take NSAIDs or aspirin. - pantoprazole (PROTONIX) 40 MG tablet; Take 1 tablet (40 mg total) by mouth 2 (two) times daily.  Dispense: 60 tablet; Refill: 1  3. Essential hypertension At goal.  DASH diet discussed and encouraged. - hydrochlorothiazide (HYDRODIURIL) 25 MG tablet; Take 1 tablet (25 mg total) by mouth daily.  Dispense: 30 tablet; Refill: 4 - potassium chloride SA (KLOR-CON) 20 MEQ tablet; Take 1 tablet (20 mEq total) by mouth daily.  Dispense: 30 tablet; Refill: 4 - Basic Metabolic Panel  4. Alcohol use disorder, moderate, in early remission (HCC) Commended him on quitting.  Encouraged him to remain alcohol free.  5. Normocytic anemia - CBC   Patient was given the opportunity to ask questions.  Patient verbalized understanding of the plan and was able to repeat key elements of the plan.   Orders Placed This Encounter  Procedures   CBC   Basic Metabolic Panel     Requested Prescriptions   Signed Prescriptions Disp Refills   hydrochlorothiazide (HYDRODIURIL) 25 MG tablet 30 tablet 4    Sig: Take 1 tablet (25 mg total) by mouth daily.   potassium chloride SA (KLOR-CON) 20 MEQ tablet 30 tablet 4    Sig: Take 1 tablet (20 mEq total) by mouth daily.   pantoprazole (PROTONIX) 40 MG tablet 60 tablet 1    Sig: Take 1 tablet (40  mg total) by mouth 2 (two) times daily.    Return if symptoms worsen or fail to improve.  Jonah Blue, MD, FACP

## 2019-10-31 ENCOUNTER — Encounter: Payer: Self-pay | Admitting: Internal Medicine

## 2019-10-31 ENCOUNTER — Telehealth: Payer: Self-pay | Admitting: Internal Medicine

## 2019-10-31 ENCOUNTER — Other Ambulatory Visit: Payer: Self-pay | Admitting: Internal Medicine

## 2019-10-31 DIAGNOSIS — E871 Hypo-osmolality and hyponatremia: Secondary | ICD-10-CM

## 2019-10-31 LAB — CBC
Hematocrit: 36.2 % — ABNORMAL LOW (ref 37.5–51.0)
Hemoglobin: 12.6 g/dL — ABNORMAL LOW (ref 13.0–17.7)
MCH: 31.8 pg (ref 26.6–33.0)
MCHC: 34.8 g/dL (ref 31.5–35.7)
MCV: 91 fL (ref 79–97)
Platelets: 272 10*3/uL (ref 150–450)
RBC: 3.96 x10E6/uL — ABNORMAL LOW (ref 4.14–5.80)
RDW: 11.7 % (ref 11.6–15.4)
WBC: 3.4 10*3/uL (ref 3.4–10.8)

## 2019-10-31 LAB — BASIC METABOLIC PANEL
BUN/Creatinine Ratio: 12 (ref 9–20)
BUN: 6 mg/dL (ref 6–20)
CO2: 25 mmol/L (ref 20–29)
Calcium: 9 mg/dL (ref 8.7–10.2)
Chloride: 88 mmol/L — ABNORMAL LOW (ref 96–106)
Creatinine, Ser: 0.52 mg/dL — ABNORMAL LOW (ref 0.76–1.27)
GFR calc Af Amer: 157 mL/min/{1.73_m2} (ref >59)
GFR calc non Af Amer: 136 mL/min/{1.73_m2} (ref >59)
Glucose: 88 mg/dL (ref 65–99)
Potassium: 4.3 mmol/L (ref 3.5–5.2)
Sodium: 125 mmol/L — ABNORMAL LOW (ref 134–144)

## 2019-10-31 MED ORDER — AMLODIPINE BESYLATE 5 MG PO TABS: 5.0000 mg | ORAL_TABLET | Freq: Every day | ORAL | 3 refills | Status: DC

## 2019-10-31 NOTE — Telephone Encounter (Signed)
Phone call placed to patient this morning to go over lab results.  The voicemail box was full on his cell phone.  I left a message on his home phone letting him know who I am and that I was calling to go over his lab results and requested that he give [place] a call back.  I subsequently sent a Mychart message with lab results.  If patient calls back, please let him know that his sodium level is low.  This is likely being caused by the hydrochlorothiazide.  I recommend that he stop the hydrochlorothiazide and potassium.  I will put him on a different blood pressure medication called amlodipine 5 mg 1 tablet daily.  He should return to the lab in 1 week for repeat sodium level.  The anemia has improved compared to when he was released from the hospital.  I have asked the lab to add iron level to blood that was already drawn.

## 2019-10-31 NOTE — Addendum Note (Signed)
Addended by: Jonah Blue B on: 10/31/2019 08:07 AM   Modules accepted: Orders

## 2019-10-31 NOTE — Telephone Encounter (Signed)
Patient called back to let the doctor know that he understood her message and if there is anything further, she can call him on his cell number.

## 2019-11-02 LAB — IRON,TIBC AND FERRITIN PANEL
Ferritin: 434 ng/mL — ABNORMAL HIGH (ref 30–400)
Iron Saturation: 33 % (ref 15–55)
Iron: 108 ug/dL (ref 38–169)
Total Iron Binding Capacity: 325 ug/dL (ref 250–450)
UIBC: 217 ug/dL (ref 111–343)

## 2019-11-02 LAB — SPECIMEN STATUS REPORT

## 2019-11-03 ENCOUNTER — Telehealth: Payer: Self-pay

## 2019-11-03 NOTE — Telephone Encounter (Signed)
error 

## 2019-11-10 ENCOUNTER — Other Ambulatory Visit: Payer: Self-pay

## 2019-11-10 ENCOUNTER — Ambulatory Visit: Payer: Self-pay | Attending: Family Medicine

## 2019-11-10 DIAGNOSIS — E871 Hypo-osmolality and hyponatremia: Secondary | ICD-10-CM

## 2019-11-11 LAB — BASIC METABOLIC PANEL
BUN/Creatinine Ratio: 5 — ABNORMAL LOW (ref 9–20)
BUN: 3 mg/dL — ABNORMAL LOW (ref 6–20)
CO2: 22 mmol/L (ref 20–29)
Calcium: 9.4 mg/dL (ref 8.7–10.2)
Chloride: 92 mmol/L — ABNORMAL LOW (ref 96–106)
Creatinine, Ser: 0.65 mg/dL — ABNORMAL LOW (ref 0.76–1.27)
GFR calc Af Amer: 144 mL/min/{1.73_m2} (ref >59)
GFR calc non Af Amer: 124 mL/min/{1.73_m2} (ref >59)
Glucose: 101 mg/dL — ABNORMAL HIGH (ref 65–99)
Potassium: 4.9 mmol/L (ref 3.5–5.2)
Sodium: 127 mmol/L — ABNORMAL LOW (ref 134–144)

## 2019-11-14 ENCOUNTER — Encounter: Payer: Self-pay | Admitting: Physician Assistant

## 2019-12-25 ENCOUNTER — Other Ambulatory Visit: Payer: Self-pay | Admitting: Internal Medicine

## 2019-12-25 ENCOUNTER — Ambulatory Visit (INDEPENDENT_AMBULATORY_CARE_PROVIDER_SITE_OTHER): Payer: Self-pay | Admitting: Physician Assistant

## 2019-12-25 ENCOUNTER — Encounter: Payer: Self-pay | Admitting: Physician Assistant

## 2019-12-25 VITALS — BP 112/74 | Ht 69.0 in | Wt 151.8 lb

## 2019-12-25 DIAGNOSIS — Z8711 Personal history of peptic ulcer disease: Secondary | ICD-10-CM

## 2019-12-25 DIAGNOSIS — Z8719 Personal history of other diseases of the digestive system: Secondary | ICD-10-CM

## 2019-12-25 DIAGNOSIS — K251 Acute gastric ulcer with perforation: Secondary | ICD-10-CM

## 2019-12-25 DIAGNOSIS — R1013 Epigastric pain: Secondary | ICD-10-CM

## 2019-12-25 NOTE — Progress Notes (Signed)
Reviewed and agree with management plan.  Casmir Auguste T. Daishon Chui, MD FACG [facility]  

## 2019-12-25 NOTE — Telephone Encounter (Signed)
   Notes to clinic Is this pt associated with your practice? Has no PCP listed but has been seen by Dr. Laural Benes.

## 2019-12-25 NOTE — Patient Instructions (Addendum)
If you are age 37 or older, your body mass index should be between 23-30. Your Body mass index is 22.42 kg/m. If this is out of the aforementioned range listed, please consider follow up with your Primary Care Provider.  If you are age 66 or younger, your body mass index should be between 19-25. Your Body mass index is 22.42 kg/m. If this is out of the aformentioned range listed, please consider follow up with your Primary Care Provider.   Continue Pantoprazole 40 mg twice daily.   You have been scheduled for an endoscopy. Please follow written instructions given to you at your visit today. If you use inhalers (even only as needed), please bring them with you on the day of your procedure.  Due to recent changes in healthcare laws, you may see the results of your imaging and laboratory studies on MyChart before your provider has had a chance to review them.  We understand that in some cases there may be results that are confusing or concerning to you. Not all laboratory results come back in the same time frame and the provider may be waiting for multiple results in order to interpret others.  Please give [place] 48 hours in order for your provider to thoroughly review all the results before contacting the office for clarification of your results.

## 2019-12-25 NOTE — Progress Notes (Signed)
Chief Complaint: Follow-up after hospitalization for perforated ulcer  HPI:    Danny Miller is a 39 year old male with a past medical history as listed below, who was referred to me by Karie Soda, MD for follow-up after recent hospitalization for perforated ulcer.      10/02/2019 patient underwent a diagnostic laparoscopy with washout and laparoscopic omental [name] patch of a perforated prepyloric ulcer.  He was discharged after 8 days.  This was thought due to alcohol consumption and binge drinking    Today, the patient tells me that he is doing well.  He continues his Pantoprazole 40 mg twice daily and tells me he has very occasional epigastric twinges of pain which lasts no longer than 1 to 2 minutes and are typically when he is bending over or moving around.  He thinks these are just from "when they did surgery".  Tells me that he has no heartburn or reflux and has decreased his alcoholic drinks to just 2 seltzers at night instead of 7 liquor drinks.    Denies fever, chills, weight loss or change in bowel habits.  Past Medical History: Perforated pre-pylori ulcer Binge Drinking  Past Surgical History:  Procedure Laterality Date  . LAPAROSCOPY N/A 10/02/2019   Procedure: LAPAROSCOPY PERFORATED ULCER;  Surgeon: Karie Soda, MD;  Location: [facility];  Service: General;  Laterality: N/A;    Current Outpatient Medications  Medication Sig Dispense Refill  . acetaminophen (TYLENOL) 325 MG tablet Take 2 tablets (650 mg total) by mouth every 6 (six) hours as needed for mild pain, moderate pain or headache.    Marland Kitchen amLODipine (NORVASC) 5 MG tablet Take 1 tablet (5 mg total) by mouth daily. 30 tablet 3  . oxyCODONE (OXY IR/ROXICODONE) 5 MG immediate release tablet You can take one tablet every 6 hours as needed for pain not relieved by Tylenol/acetaminophen. 15 tablet 0  . pantoprazole (PROTONIX) 40 MG tablet TAKE 1 TABLET(40 MG) BY MOUTH TWICE DAILY 60 tablet 5   No current facility-administered  medications for this visit.    Allergies as of 12/25/2019 - Review Complete 10/30/2019  Allergen Reaction Noted  . Nsaids Other (See Comments) 10/02/2019  . Penicillins  10/01/2019    No family history on file.  Social History   Socioeconomic History  . Marital status: Single    Spouse name: Not on file  . Number of children: Not on file  . Years of education: Not on file  . Highest education level: Not on file  Occupational History  . Not on file  Tobacco Use  . Smoking status: Never Smoker  . Smokeless tobacco: Never Used  Vaping Use  . Vaping Use: Every day  Substance and Sexual Activity  . Alcohol use: Yes  . Drug use: Never  . Sexual activity: Not on file  Other Topics Concern  . Not on file  Social History Narrative  . Not on file   Social Determinants of Health   Financial Resource Strain:   . Difficulty of Paying Living Expenses: Not on file  Food Insecurity:   . Worried About Programme researcher, broadcasting/film/video in the Last Year: Not on file  . Ran Out of Food in the Last Year: Not on file  Transportation Needs:   . Lack of Transportation (Medical): Not on file  . Lack of Transportation (Non-Medical): Not on file  Physical Activity:   . Days of Exercise per Week: Not on file  . Minutes of Exercise per Session: Not on  file  Stress:   . Feeling of Stress : Not on file  Social Connections:   . Frequency of Communication with Friends and Family: Not on file  . Frequency of Social Gatherings with Friends and Family: Not on file  . Attends Religious Services: Not on file  . Active Member of Clubs or Organizations: Not on file  . Attends Banker Meetings: Not on file  . Marital Status: Not on file  Intimate Partner Violence:   . Fear of Current or Ex-Partner: Not on file  . Emotionally Abused: Not on file  . Physically Abused: Not on file  . Sexually Abused: Not on file    Review of Systems:    Constitutional: No weight loss, fever or chills Skin: No  rash  Cardiovascular: No chest pain  Respiratory: No SOB  Gastrointestinal: See HPI and otherwise negative Genitourinary: No dysuria Neurological: No headache, dizziness or syncope Musculoskeletal: No new muscle or joint pain Hematologic: No bleeding Psychiatric: No history of depression or anxiety   Physical Exam:  Vital signs: BP 112/74   Ht 5\' 9"  (1.753 m)   Wt 151 lb 12.8 oz (68.9 kg)   BMI 22.42 kg/m   Constitutional:   Pleasant Caucasian male appears to be in NAD, Well developed, Well nourished, alert and cooperative Head:  Normocephalic and atraumatic. Eyes:   PEERL, EOMI. No icterus. Conjunctiva pink. Ears:  Normal auditory acuity. Neck:  Supple Throat: Oral cavity and pharynx without inflammation, swelling or lesion.  Respiratory: Respirations even and unlabored. Lungs clear to auscultation bilaterally.   No wheezes, crackles, or rhonchi.  Cardiovascular: Normal S1, S2. No MRG. Regular rate and rhythm. No peripheral edema, cyanosis or pallor.  Gastrointestinal:  Soft, nondistended, nontender. No rebound or guarding. Normal bowel sounds. No appreciable masses or hepatomegaly. Rectal:  Not performed.  Msk:  Symmetrical without gross deformities. Without edema, no deformity or joint abnormality.  Neurologic:  Alert and  oriented x4;  grossly normal neurologically.  Skin:   Dry and intact without significant lesions or rashes. Psychiatric:  Demonstrates good judgement and reason without abnormal affect or behaviors.  MOST RECENT LABS AND IMAGING: CBC    Component Value Date/Time   WBC 3.4 10/30/2019 1515   WBC 6.5 10/06/2019 0600   RBC 3.96 (L) 10/30/2019 1515   RBC 3.51 (L) 10/06/2019 0600   HGB 12.6 (L) 10/30/2019 1515   HCT 36.2 (L) 10/30/2019 1515   PLT 272 10/30/2019 1515   MCV 91 10/30/2019 1515   MCH 31.8 10/30/2019 1515   MCH 32.5 10/06/2019 0600   MCHC 34.8 10/30/2019 1515   MCHC 33.1 10/06/2019 0600   RDW 11.7 10/30/2019 1515    CMP     Component  Value Date/Time   NA 127 (L) 11/10/2019 0910   K 4.9 11/10/2019 0910   CL 92 (L) 11/10/2019 0910   CO2 22 11/10/2019 0910   GLUCOSE 101 (H) 11/10/2019 0910   GLUCOSE 92 10/06/2019 0600   BUN 3 (L) 11/10/2019 0910   CREATININE 0.65 (L) 11/10/2019 0910   CALCIUM 9.4 11/10/2019 0910   PROT 7.8 10/01/2019 2114   ALBUMIN 4.5 10/01/2019 2114   AST 28 10/01/2019 2114   ALT 17 10/01/2019 2114   ALKPHOS 82 10/01/2019 2114   BILITOT 0.7 10/01/2019 2114   GFRNONAA 124 11/10/2019 0910   GFRAA 144 11/10/2019 0910    Assessment: 1.  Perforated prepyloric ulcer: Status post laparoscopic repair 10/02/2019 2.  Epigastric pain: Only with movement  and bending over at times; likely adhesional  Plan: 1.  Scheduled patient for a follow-up EGD with Dr. Russella Dar.  Did discuss risks, benefits, limitations and alternatives and the patient agrees to proceed. He has had his COVID vaccines. 2.  Again discussed that the patient should discontinue use of alcohol 3.  Continue Pantoprazole 40 mg twice daily, 30-60 minutes before breakfast and dinner.  Patient told me he did not need refills of this. 4.  Patient to follow in clinic per recommendations from Dr. Russella Dar after time of procedure.  Danny Meeker, PA-C [facility] 12/25/2019, 1:39 PM  Cc: Karie Soda, MD

## 2019-12-30 ENCOUNTER — Encounter: Payer: Self-pay | Admitting: Certified Registered Nurse Anesthetist

## 2019-12-31 ENCOUNTER — Encounter: Payer: Self-pay | Admitting: Gastroenterology

## 2019-12-31 ENCOUNTER — Ambulatory Visit (AMBULATORY_SURGERY_CENTER): Payer: Self-pay | Admitting: Gastroenterology

## 2019-12-31 ENCOUNTER — Other Ambulatory Visit: Payer: Self-pay

## 2019-12-31 VITALS — BP 112/76 | HR 66 | Temp 97.3°F | Resp 13 | Ht 69.0 in | Wt 151.0 lb

## 2019-12-31 DIAGNOSIS — K255 Chronic or unspecified gastric ulcer with perforation: Secondary | ICD-10-CM

## 2019-12-31 DIAGNOSIS — K2970 Gastritis, unspecified, without bleeding: Secondary | ICD-10-CM

## 2019-12-31 DIAGNOSIS — K269 Duodenal ulcer, unspecified as acute or chronic, without hemorrhage or perforation: Secondary | ICD-10-CM

## 2019-12-31 DIAGNOSIS — K3189 Other diseases of stomach and duodenum: Secondary | ICD-10-CM

## 2019-12-31 DIAGNOSIS — K319 Disease of stomach and duodenum, unspecified: Secondary | ICD-10-CM

## 2019-12-31 MED ORDER — SODIUM CHLORIDE 0.9 % IV SOLN: 500.0000 mL | Freq: Once | INTRAVENOUS | Status: DC

## 2019-12-31 NOTE — Progress Notes (Signed)
Called to room to assist during endoscopic procedure.  Patient ID and intended procedure confirmed with present staff. Received instructions for my participation in the procedure from the performing physician.  

## 2019-12-31 NOTE — Op Note (Signed)
[facility] Patient Name: Danny Miller Procedure Date: 12/31/2019 10:09 AM MRN: [id number] Endoscopist: Meryl Dare , MD Age: 39 Referring MD:  Date of Birth: 1981-02-18 Gender: Male Account #: [id number] Procedure:                Upper GI endoscopy Indications:              Follow-up of gastric ulcer with perforation Medicines:                Monitored Anesthesia Care Procedure:                Pre-Anesthesia Assessment:                           - Prior to the procedure, a History and Physical                            was performed, and patient medications and                            allergies were reviewed. The patient's tolerance of                            previous anesthesia was also reviewed. The risks                            and benefits of the procedure and the sedation                            options and risks were discussed with the patient.                            All questions were answered, and informed consent                            was obtained. Prior Anticoagulants: The patient has                            taken no previous anticoagulant or antiplatelet                            agents. ASA Grade Assessment: II - A patient with                            mild systemic disease. After reviewing the risks                            and benefits, the patient was deemed in                            satisfactory condition to undergo the procedure.                           After obtaining informed consent, the endoscope was  passed under direct vision. Throughout the                            procedure, the patient's blood pressure, pulse, and                            oxygen saturations were monitored continuously. The                            Endoscope was introduced through the mouth, and                            advanced to the second part of duodenum. The upper                            GI endoscopy  was accomplished without difficulty.                            The patient tolerated the procedure well. Scope In: Scope Out: Findings:                 The examined esophagus was normal.                           Patchy mildly erythematous mucosa without bleeding                            was found in the entire examined stomach. Biopsies                            were taken with a cold forceps for histology.                           The exam of the stomach was otherwise normal.                           One non-bleeding healing superficial duodenal ulcer                            with no stigmata of bleeding was found in the                            duodenal bulb on the posterior wall. The ulcer was                            3 mm in largest dimension and the healing area was                            12 mm in largest dimension.                           The exam of the duodenum was otherwise normal. Complications:            No immediate complications. Estimated Blood Loss:  Estimated blood loss was minimal. Impression:               - Normal esophagus.                           - Erythematous mucosa in the stomach. Biopsied.                           - Non-bleeding healing duodenal bulb ulcer with no                            stigmata of bleeding. Recommendation:           - Patient has a contact number available for                            emergencies. The signs and symptoms of potential                            delayed complications were discussed with the                            patient. Return to normal activities tomorrow.                            Written discharge instructions were provided to the                            patient.                           - Resume previous diet.                           - Continue present medications.                           - Change pantoprazole 40 mg po qd long term.                           - No aspirin,  ibuprofen, naproxen, or other                            non-steroidal anti-inflammatory drugs long term.                           - Await pathology results. Meryl Dare, MD 12/31/2019 10:36:20 AM This report has been signed electronically.

## 2019-12-31 NOTE — Progress Notes (Signed)
1014 Robinul 0.1 mg IV given due large amount of secretions upon assessment.  MD made aware, vss  

## 2019-12-31 NOTE — Patient Instructions (Signed)
Please read handouts provided. Continue present medications. Change pantoprazole 40 mg to once daily for long term. No aspirin, ibuprofen, naproxen, or other non-steriodal anti-inflammatory drugs long term. Await pathology results.     YOU HAD AN ENDOSCOPIC PROCEDURE TODAY AT THE [facility]:   Refer to the procedure report that was given to you for any specific questions about what was found during the examination.  If the procedure report does not answer your questions, please call your gastroenterologist to clarify.  If you requested that your care partner not be given the details of your procedure findings, then the procedure report has been included in a sealed envelope for you to review at your convenience later.  YOU SHOULD EXPECT: Some feelings of bloating in the abdomen. Passage of more gas than usual.  Walking can help get rid of the air that was put into your GI tract during the procedure and reduce the bloating. If you had a lower endoscopy (such as a colonoscopy or flexible sigmoidoscopy) you may notice spotting of blood in your stool or on the toilet paper. If you underwent a bowel prep for your procedure, you may not have a normal bowel movement for a few days.  Please Note:  You might notice some irritation and congestion in your nose or some drainage.  This is from the oxygen used during your procedure.  There is no need for concern and it should clear up in a day or so.  SYMPTOMS TO REPORT IMMEDIATELY:    Following upper endoscopy (EGD)  Vomiting of blood or coffee ground material  New chest pain or pain under the shoulder blades  Painful or persistently difficult swallowing  New shortness of breath  Fever of 100F or higher  Black, tarry-looking stools  For urgent or emergent issues, a gastroenterologist can be reached at any hour by calling [phone]. Do not use MyChart messaging for urgent concerns.    DIET:  We do recommend a small meal at first,  but then you may proceed to your regular diet.  Drink plenty of fluids but you should avoid alcoholic beverages for 24 hours.  ACTIVITY:  You should plan to take it easy for the rest of today and you should NOT DRIVE or use heavy machinery until tomorrow (because of the sedation medicines used during the test).    FOLLOW UP: Our staff will call the number listed on your records 48-72 hours following your procedure to check on you and address any questions or concerns that you may have regarding the information given to you following your procedure. If we do not reach you, we will leave a message.  We will attempt to reach you two times.  During this call, we will ask if you have developed any symptoms of COVID 19. If you develop any symptoms (ie: fever, flu-like symptoms, shortness of breath, cough etc.) before then, please call [phone].  If you test positive for Covid 19 in the 2 weeks post procedure, please call and report this information to [place].    If any biopsies were taken you will be contacted by phone or by letter within the next 1-3 weeks.  Please call us at [phone] if you have not heard about the biopsies in 3 weeks.    SIGNATURES/CONFIDENTIALITY: You and/or your care partner have signed paperwork which will be entered into your electronic medical record.  These signatures attest to the fact that that the information above on your After Visit Summary  has been reviewed and is understood.  Full responsibility of the confidentiality of this discharge information lies with you and/or your care-partner. 

## 2019-12-31 NOTE — Progress Notes (Signed)
Report given to PACU, vss 

## 2019-12-31 NOTE — Progress Notes (Signed)
VS by CW. ?

## 2020-01-02 ENCOUNTER — Telehealth: Payer: Self-pay | Admitting: *Deleted

## 2020-01-02 NOTE — Telephone Encounter (Signed)
  Follow up Call-  Call back number 12/31/2019  Post procedure Call Back phone  # [phone]  Permission to leave phone message Yes  Some recent data might be hidden     Patient questions:  Do you have a fever, pain , or abdominal swelling? No. Pain Score  0 *  Have you tolerated food without any problems? Yes.    Have you been able to return to your normal activities? Yes.    Do you have any questions about your discharge instructions: Diet   No. Medications  No. Follow up visit  No.  Do you have questions or concerns about your Care? No.  Actions: * If pain score is 4 or above: 1. No action needed, pain <4.Have you developed a fever since your procedure? no  2.   Have you had an respiratory symptoms (SOB or cough) since your procedure? no  3.   Have you tested positive for COVID 19 since your procedure no  4.   Have you had any family members/close contacts diagnosed with the COVID 19 since your procedure?  no   If yes to any of these questions please route to Laverna Peace, RN and Karlton Lemon, RN

## 2020-01-13 ENCOUNTER — Encounter: Payer: Self-pay | Admitting: Gastroenterology

## 2020-02-24 ENCOUNTER — Other Ambulatory Visit: Payer: Self-pay | Admitting: Internal Medicine

## 2020-05-24 ENCOUNTER — Other Ambulatory Visit: Payer: Self-pay | Admitting: Internal Medicine

## 2020-06-24 ENCOUNTER — Other Ambulatory Visit: Payer: Self-pay | Admitting: Internal Medicine

## 2020-06-24 NOTE — Telephone Encounter (Signed)
Refusing request-Patient is no longer under our pre scriber's care. Meredith Mody listed as PCP.

## 2020-11-04 IMAGING — RF DG UGI W SINGLE CM
7 of 10 series · 7 of 19 positions shown · IV contrast (omnipaque)
Comparison: No priors.

CLINICAL DATA: 39-year-old male with history of perforated gastric
ulcer.

EXAM:
WATER SOLUBLE UPPER GI SERIES
TECHNIQUE: Single-column upper GI series was performed using water soluble
contrast.
CONTRAST:  50 mL of Omnipaque 300.

[Series 1: t abdomen supine · 0.15mm/px · 1 of 1 slices shown]
[im 1/1]
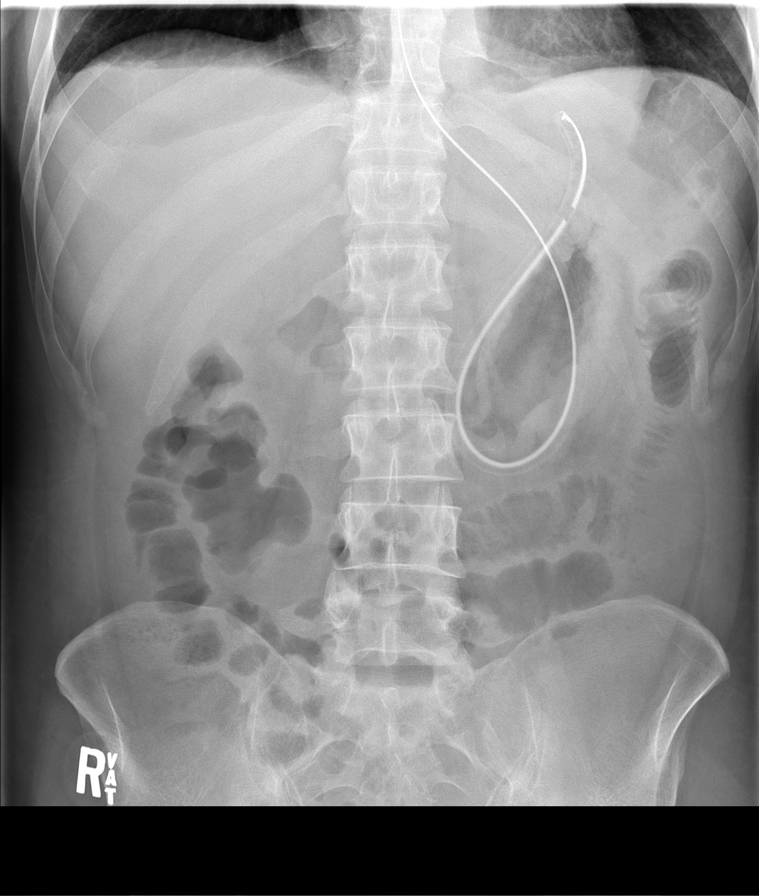

[Series 2: fluoro_barium 2fps_bw · 0.17mm/px · 1 of 1 slices shown (1 of 6)]
[im 1/1]
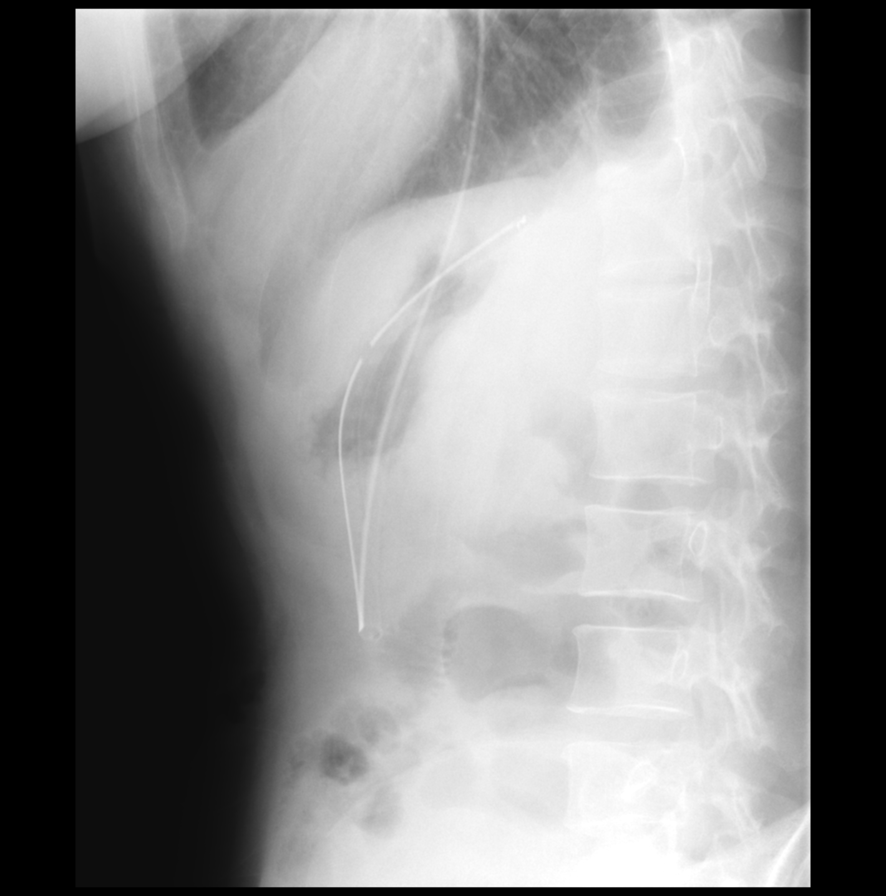

[Series 4: fluoro_barium 2fps_bw · 0.17mm/px · 1 of 1 slices shown (2 of 6)]
[im 1/1]
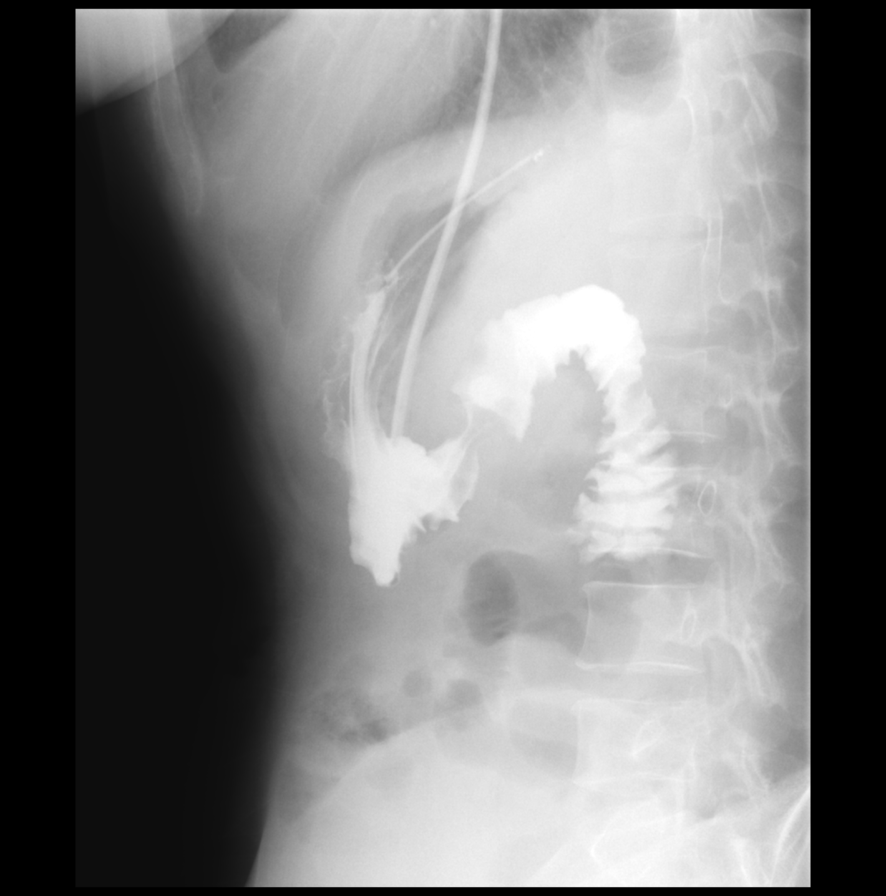

[Series 5: fluoro_barium 2fps_bw · 0.17mm/px · 1 of 1 slices shown (3 of 6)]
[im 1/1]
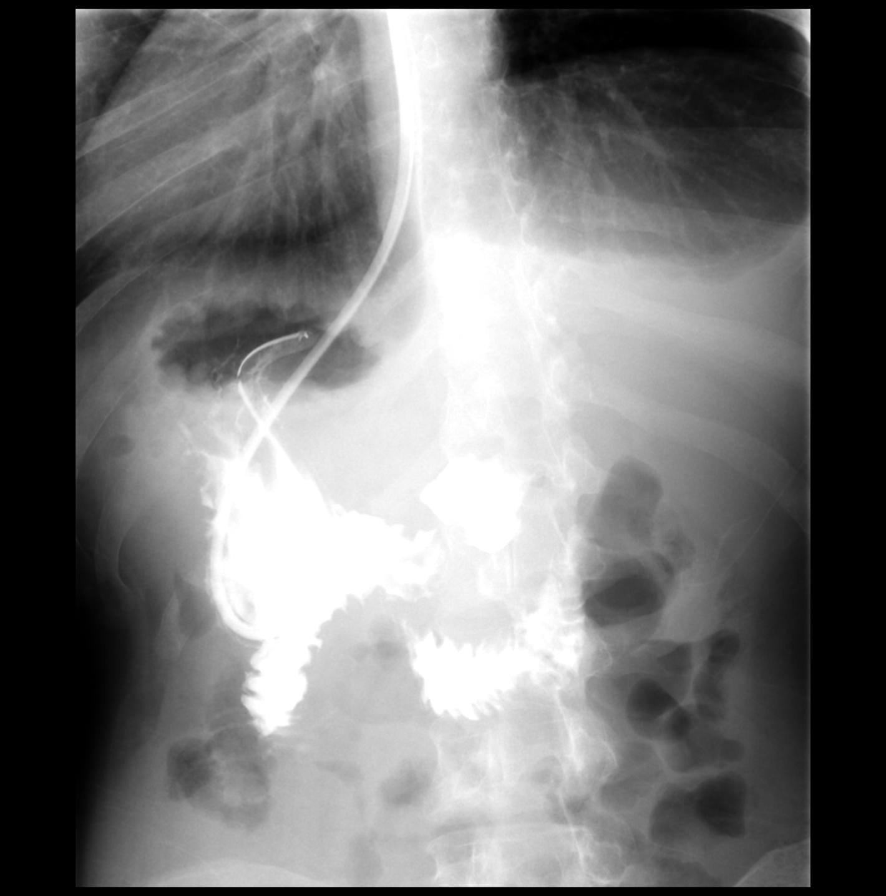

[Series 6: fluoro_barium 2fps_bw · 0.17mm/px · 1 of 1 slices shown (4 of 6)]
[im 1/1]
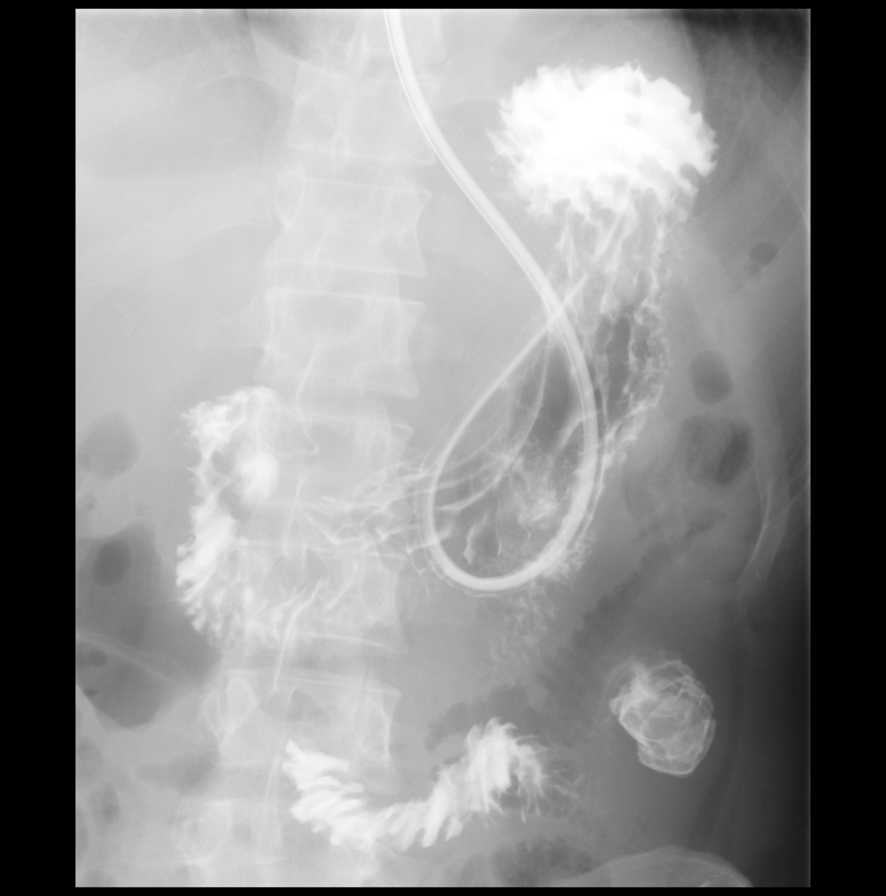

[Series 8: fluoro_barium 2fps_bw · 0.17mm/px · 1 of 1 slices shown (5 of 6)]
[im 1/1]
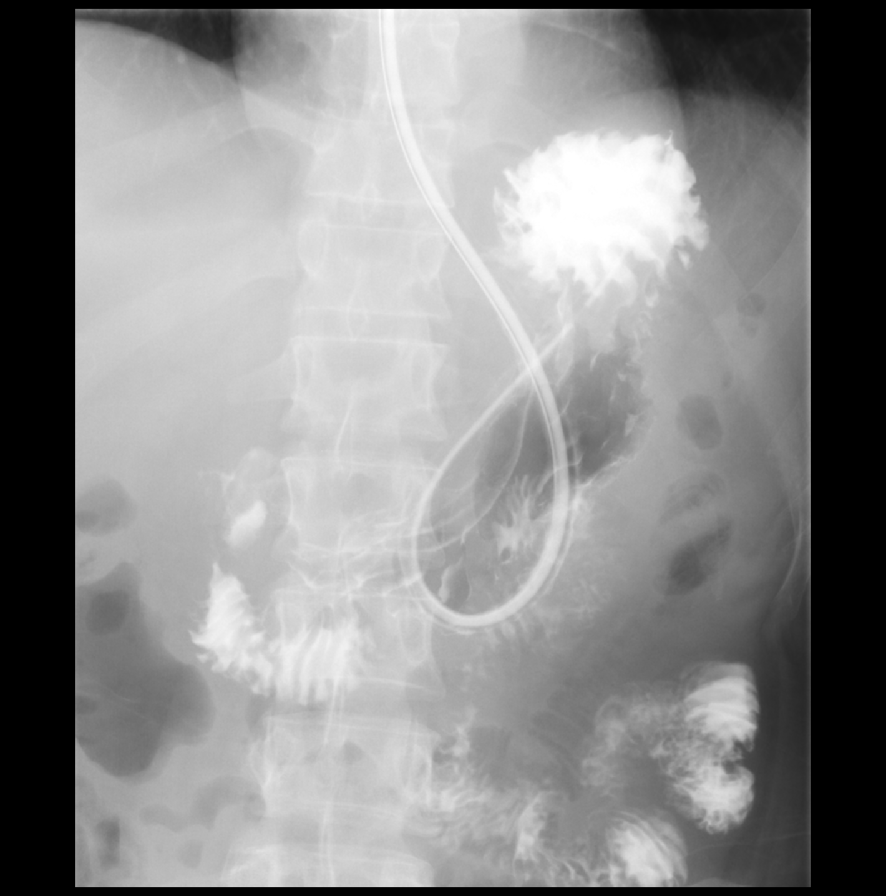

[Series 10: fluoro_barium 2fps_bw · 0.17mm/px · 1 of 1 slices shown (6 of 6)]
[im 1/1]
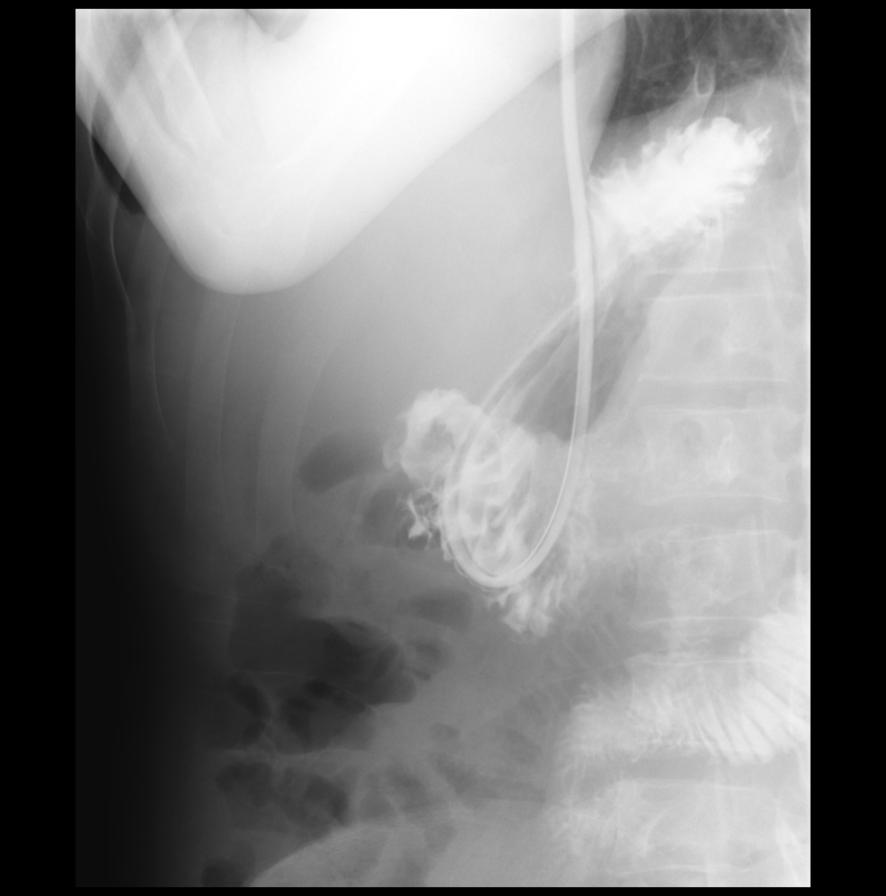

[7 of 19 positions shown; findings below may reference images not displayed]

FLUOROSCOPY TIME:  Fluoroscopy Time:  1 minutes 54 seconds

Radiation Exposure Index (if provided by the fluoroscopic device):
22.8 mGy
FINDINGS: 50 mL of Omnipaque 300 was infused into the patient's pre-existing
nasogastric tube while the patient was placed in a RPO position.
Contrast readily filled the stomach and traversed the pylorus into
the duodenum. Once in the duodenum there was persistent contrast
pooling in the duodenal bulb throughout the examination, presumably
at the site of the surgically repaired ulcer. No contrast
extravasation was noted at any time during the examination to
suggest persistent leak.
IMPRESSION: 1. Findings compatible with surgically repaired ulcer in the
duodenal bulb with no evidence of persistent leak.

## 2021-01-19 ENCOUNTER — Other Ambulatory Visit: Payer: Self-pay | Admitting: Internal Medicine

## 2021-01-19 DIAGNOSIS — K251 Acute gastric ulcer with perforation: Secondary | ICD-10-CM

## 2021-01-19 NOTE — Telephone Encounter (Signed)
Requested medications are due for refill today.  yes  Requested medications are on the active medications list.  yes  Last refill. 12/25/2019  Future visit scheduled.   no  Notes to clinic.  PCP listed Raenette Rover. Pt last seen 1 year ago.
